# Patient Record
Sex: Female | Born: 1990 | Race: White | Hispanic: No | Marital: Married | Smoking: Never smoker
Health system: Southern US, Community
[De-identification: ages and names within clinical notes are randomized; demographics above are authoritative.]

## PROBLEM LIST (undated history)

## (undated) DIAGNOSIS — K219 Gastro-esophageal reflux disease without esophagitis: Secondary | ICD-10-CM

## (undated) DIAGNOSIS — D696 Thrombocytopenia, unspecified: Secondary | ICD-10-CM

## (undated) DIAGNOSIS — F419 Anxiety disorder, unspecified: Secondary | ICD-10-CM

## (undated) DIAGNOSIS — K589 Irritable bowel syndrome without diarrhea: Secondary | ICD-10-CM

## (undated) HISTORY — DX: Thrombocytopenia, unspecified: D69.6

## (undated) HISTORY — DX: Gastro-esophageal reflux disease without esophagitis: K21.9

## (undated) HISTORY — DX: Irritable bowel syndrome, unspecified: K58.9

## (undated) HISTORY — DX: Anxiety disorder, unspecified: F41.9

## (undated) HISTORY — PX: JOINT REPLACEMENT: SHX530

---
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
## 2008-09-29 ENCOUNTER — Ambulatory Visit (HOSPITAL_COMMUNITY): Admission: RE | Admit: 2008-09-29 | Discharge: 2008-09-29 | Payer: Self-pay | Admitting: Pediatrics

## 2008-10-28 ENCOUNTER — Other Ambulatory Visit: Admission: RE | Admit: 2008-10-28 | Discharge: 2008-10-28 | Payer: Self-pay | Admitting: Family Medicine

## 2010-01-10 ENCOUNTER — Other Ambulatory Visit: Admission: RE | Admit: 2010-01-10 | Discharge: 2010-01-10 | Payer: Self-pay | Admitting: Family Medicine

## 2010-12-06 NOTE — Procedures (Signed)
EEG NUMBER:  [id number]   CLINICAL HISTORY:  The patient is a 20 year old female who had episodes  when she drives through groves of trees where light flashes and she  feels strange and tries to look away.  She took lorazepam before the  test.  (780.02)   PROCEDURE:  The tracing is carried out on a 32-channel digital Cadwell  recorder reformatted into 16 channel montages with one devoted to EKG.  The patient was awake and drowsy during the recording.  The  International 10/20 system lead placement was used.  Medications include  Zoloft, Nexium, and lorazepam.   DESCRIPTION OF FINDINGS:  Dominant frequency is a 11 Hz, 60 microvolt  alpha range activity.  This is superimposed on mixed frequency theta and  lower alpha range activity of 25 microvolts that persists throughout the  record.   Activating procedures with photic stimulation introduced to sustain  driving response from 8-29 Hz without any seizure activity.  Hyperventilation did not cause any significant change.  There was no  interictal epileptiform activity in the form of spikes or sharp waves.  EKG showed a regular sinus rhythm with ventricular response of 54 beats  per minute.   IMPRESSION:  Normal waking record.      Deanna Artis. Sharene Skeans, M.D.  Electronically Signed     FAO:ZHYQ  D:  09/29/2008 21:28:14  T:  09/30/2008 04:19:10  Job #:  [id number]

## 2011-01-26 ENCOUNTER — Other Ambulatory Visit (HOSPITAL_COMMUNITY)
Admission: RE | Admit: 2011-01-26 | Discharge: 2011-01-26 | Disposition: A | Discharge status: Home / Self Care | Payer: BC Managed Care – PPO | Source: Ambulatory Visit | Attending: Family Medicine | Admitting: Family Medicine

## 2011-01-26 ENCOUNTER — Other Ambulatory Visit: Payer: Self-pay | Admitting: Family Medicine

## 2011-01-26 DIAGNOSIS — Z124 Encounter for screening for malignant neoplasm of cervix: Secondary | ICD-10-CM | POA: Insufficient documentation

## 2011-01-26 DIAGNOSIS — Z113 Encounter for screening for infections with a predominantly sexual mode of transmission: Secondary | ICD-10-CM | POA: Insufficient documentation

## 2012-03-01 ENCOUNTER — Other Ambulatory Visit (HOSPITAL_COMMUNITY)
Admission: RE | Admit: 2012-03-01 | Discharge: 2012-03-01 | Disposition: A | Discharge status: Home / Self Care | Payer: BC Managed Care – PPO | Source: Ambulatory Visit | Attending: Family Medicine | Admitting: Family Medicine

## 2012-03-01 ENCOUNTER — Other Ambulatory Visit: Payer: Self-pay | Admitting: Family Medicine

## 2012-03-01 DIAGNOSIS — Z124 Encounter for screening for malignant neoplasm of cervix: Secondary | ICD-10-CM | POA: Insufficient documentation

## 2012-03-22 ENCOUNTER — Other Ambulatory Visit: Payer: Self-pay | Admitting: Dermatology

## 2014-08-26 ENCOUNTER — Other Ambulatory Visit (HOSPITAL_COMMUNITY)
Admission: RE | Admit: 2014-08-26 | Discharge: 2014-08-26 | Disposition: A | Discharge status: Home / Self Care | Payer: BLUE CROSS/BLUE SHIELD | Source: Ambulatory Visit | Attending: Family Medicine | Admitting: Family Medicine

## 2014-08-26 ENCOUNTER — Other Ambulatory Visit: Payer: Self-pay | Admitting: Family Medicine

## 2014-08-26 DIAGNOSIS — Z124 Encounter for screening for malignant neoplasm of cervix: Secondary | ICD-10-CM | POA: Insufficient documentation

## 2014-08-26 DIAGNOSIS — Z113 Encounter for screening for infections with a predominantly sexual mode of transmission: Secondary | ICD-10-CM | POA: Insufficient documentation

## 2014-08-28 LAB — CYTOLOGY - PAP

## 2014-12-01 ENCOUNTER — Other Ambulatory Visit: Payer: Self-pay | Admitting: Gastroenterology

## 2014-12-01 DIAGNOSIS — K219 Gastro-esophageal reflux disease without esophagitis: Principal | ICD-10-CM

## 2014-12-01 DIAGNOSIS — IMO0001 Reserved for inherently not codable concepts without codable children: Secondary | ICD-10-CM

## 2014-12-03 ENCOUNTER — Other Ambulatory Visit: Payer: BLUE CROSS/BLUE SHIELD

## 2014-12-04 ENCOUNTER — Ambulatory Visit
Admission: RE | Admit: 2014-12-04 | Discharge: 2014-12-04 | Disposition: A | Discharge status: Home / Self Care | Payer: BLUE CROSS/BLUE SHIELD | Source: Ambulatory Visit | Attending: Gastroenterology | Admitting: Gastroenterology

## 2014-12-04 DIAGNOSIS — K219 Gastro-esophageal reflux disease without esophagitis: Principal | ICD-10-CM

## 2014-12-04 DIAGNOSIS — IMO0001 Reserved for inherently not codable concepts without codable children: Secondary | ICD-10-CM

## 2015-09-28 ENCOUNTER — Other Ambulatory Visit (HOSPITAL_COMMUNITY)
Admission: RE | Admit: 2015-09-28 | Discharge: 2015-09-28 | Disposition: A | Discharge status: Home / Self Care | Payer: Commercial Managed Care - HMO | Source: Ambulatory Visit | Attending: Family Medicine | Admitting: Family Medicine

## 2015-09-28 ENCOUNTER — Other Ambulatory Visit: Payer: Self-pay | Admitting: Family Medicine

## 2015-09-28 DIAGNOSIS — Z124 Encounter for screening for malignant neoplasm of cervix: Secondary | ICD-10-CM | POA: Insufficient documentation

## 2015-10-01 LAB — CYTOLOGY - PAP

## 2017-11-09 ENCOUNTER — Other Ambulatory Visit: Payer: Self-pay | Admitting: Family Medicine

## 2017-11-09 DIAGNOSIS — K219 Gastro-esophageal reflux disease without esophagitis: Secondary | ICD-10-CM

## 2017-11-09 DIAGNOSIS — R748 Abnormal levels of other serum enzymes: Secondary | ICD-10-CM

## 2017-11-09 DIAGNOSIS — Z0001 Encounter for general adult medical examination with abnormal findings: Secondary | ICD-10-CM

## 2017-11-09 DIAGNOSIS — B369 Superficial mycosis, unspecified: Secondary | ICD-10-CM

## 2017-11-09 DIAGNOSIS — F411 Generalized anxiety disorder: Secondary | ICD-10-CM

## 2017-11-19 ENCOUNTER — Ambulatory Visit
Admission: RE | Admit: 2017-11-19 | Discharge: 2017-11-19 | Disposition: A | Discharge status: Home / Self Care | Payer: 59 | Source: Ambulatory Visit | Attending: Family Medicine | Admitting: Family Medicine

## 2017-11-19 DIAGNOSIS — B369 Superficial mycosis, unspecified: Secondary | ICD-10-CM

## 2017-11-19 DIAGNOSIS — K219 Gastro-esophageal reflux disease without esophagitis: Secondary | ICD-10-CM

## 2017-11-19 DIAGNOSIS — Z0001 Encounter for general adult medical examination with abnormal findings: Secondary | ICD-10-CM

## 2017-11-19 DIAGNOSIS — F411 Generalized anxiety disorder: Secondary | ICD-10-CM

## 2017-11-19 DIAGNOSIS — R748 Abnormal levels of other serum enzymes: Secondary | ICD-10-CM

## 2018-02-08 LAB — OB RESULTS CONSOLE ABO/RH: RH TYPE: POSITIVE

## 2018-02-08 LAB — OB RESULTS CONSOLE RUBELLA ANTIBODY, IGM: Rubella: IMMUNE

## 2018-02-08 LAB — OB RESULTS CONSOLE HIV ANTIBODY (ROUTINE TESTING): HIV: NONREACTIVE

## 2018-02-08 LAB — OB RESULTS CONSOLE GC/CHLAMYDIA
CHLAMYDIA, DNA PROBE: NEGATIVE
Gonorrhea: NEGATIVE

## 2018-02-08 LAB — OB RESULTS CONSOLE RPR: RPR: NONREACTIVE

## 2018-02-08 LAB — OB RESULTS CONSOLE HEPATITIS B SURFACE ANTIGEN: HEP B S AG: NEGATIVE

## 2018-02-08 LAB — OB RESULTS CONSOLE ANTIBODY SCREEN: ANTIBODY SCREEN: NEGATIVE

## 2018-04-16 ENCOUNTER — Telehealth: Payer: Self-pay | Admitting: Hematology and Oncology

## 2018-04-16 NOTE — Telephone Encounter (Signed)
New referral received from [facility] for thrombocytopenia. Pt has been scheduled to see Dr. Caron Presumeuben on 9/25 at 1pm. Pt aware to arrive 30 minutes early.

## 2018-04-17 ENCOUNTER — Inpatient Hospital Stay: Payer: 59 | Attending: Hematology and Oncology | Admitting: Hematology and Oncology

## 2018-04-17 ENCOUNTER — Encounter: Payer: Self-pay | Admitting: Hematology and Oncology

## 2018-04-17 ENCOUNTER — Inpatient Hospital Stay: Payer: 59

## 2018-04-17 ENCOUNTER — Telehealth: Payer: Self-pay | Admitting: Hematology and Oncology

## 2018-04-17 DIAGNOSIS — K219 Gastro-esophageal reflux disease without esophagitis: Secondary | ICD-10-CM | POA: Insufficient documentation

## 2018-04-17 DIAGNOSIS — K589 Irritable bowel syndrome without diarrhea: Secondary | ICD-10-CM | POA: Insufficient documentation

## 2018-04-17 DIAGNOSIS — O99119 Other diseases of the blood and blood-forming organs and certain disorders involving the immune mechanism complicating pregnancy, unspecified trimester: Principal | ICD-10-CM

## 2018-04-17 DIAGNOSIS — D696 Thrombocytopenia, unspecified: Secondary | ICD-10-CM | POA: Diagnosis not present

## 2018-04-17 DIAGNOSIS — K582 Mixed irritable bowel syndrome: Secondary | ICD-10-CM

## 2018-04-17 LAB — COMPREHENSIVE METABOLIC PANEL
ALT: 24 U/L (ref 0–44)
ANION GAP: 7 (ref 5–15)
AST: 19 U/L (ref 15–41)
Albumin: 3.4 g/dL — ABNORMAL LOW (ref 3.5–5.0)
Alkaline Phosphatase: 81 U/L (ref 38–126)
BILIRUBIN TOTAL: 0.3 mg/dL (ref 0.3–1.2)
BUN: 9 mg/dL (ref 6–20)
CO2: 25 mmol/L (ref 22–32)
Calcium: 9 mg/dL (ref 8.9–10.3)
Chloride: 106 mmol/L (ref 98–111)
Creatinine, Ser: 0.61 mg/dL (ref 0.44–1.00)
GFR calc Af Amer: >60 mL/min (ref >60)
GFR calc non Af Amer: >60 mL/min (ref >60)
GLUCOSE: 84 mg/dL (ref 70–99)
POTASSIUM: 4 mmol/L (ref 3.5–5.1)
Sodium: 138 mmol/L (ref 135–145)
TOTAL PROTEIN: 6.7 g/dL (ref 6.5–8.1)

## 2018-04-17 LAB — CBC WITH DIFFERENTIAL (CANCER CENTER ONLY)
BASOS ABS: 0 10*3/uL (ref 0.0–0.1)
Basophils Relative: 0 %
EOS PCT: 1 %
Eosinophils Absolute: 0.1 10*3/uL (ref 0.0–0.5)
HEMATOCRIT: 34.7 % — AB (ref 34.8–46.6)
HEMOGLOBIN: 11.9 g/dL (ref 11.6–15.9)
LYMPHS ABS: 2.8 10*3/uL (ref 0.9–3.3)
LYMPHS PCT: 27 %
MCH: 30.5 pg (ref 25.1–34.0)
MCHC: 34.3 g/dL (ref 31.5–36.0)
MCV: 89 fL (ref 79.5–101.0)
Monocytes Absolute: 0.6 10*3/uL (ref 0.1–0.9)
Monocytes Relative: 5 %
NEUTROS ABS: 6.7 10*3/uL — AB (ref 1.5–6.5)
Neutrophils Relative %: 67 %
PLATELETS: 127 10*3/uL — AB (ref 145–400)
RBC: 3.9 MIL/uL (ref 3.70–5.45)
RDW: 13.8 % (ref 11.2–14.5)
WBC: 10.1 10*3/uL (ref 3.9–10.3)

## 2018-04-17 LAB — PROTIME-INR
INR: 0.81
Prothrombin Time: 11.1 seconds — ABNORMAL LOW (ref 11.4–15.2)

## 2018-04-17 LAB — APTT: APTT: 26 s (ref 24–36)

## 2018-04-17 NOTE — Telephone Encounter (Signed)
Gave pt avs and calendar  °

## 2018-04-17 NOTE — Progress Notes (Signed)
[facility] Outpatient Hematology/Oncology Initial Consultation  Patient Name:  Debra Nixon  DOB: Dec 13, 1990   Date of Service: April 17, 2018  Referring Provider: Dois Davenport, Md [address] [place], [place] [address]   Consulting Physician: Toni Arthurs, MD Hematology/Oncology  Patient Care Team: Patient Care Team: Dois Davenport, MD as PCP - General (Family Medicine)    Reason for Referral: In the setting of a prior history of thrombocytopenia of unclear etiology at least 5 years prior to this, her first pregnancy, she presents now with mild thrombocytopenia (119,000) without bleeding tendency for further diagnostic and therapeutic recommendations.  History Present Illness: Debra Nixon is a 27 year old resident of [place] whose past medical history is significant for gastroesophageal reflux disease for the past 20 years; irritable bowel syndrome, associated with mild bloating and episodic diarrhea and especially constipation; and for at least the past 5 years, mild thrombocytopenia (>100,000) of unclear etiology.  Her best recollection through a different primary care provider, was that her baseline prepregnancy platelet count was 130-140,000 platelets.  She has never had any bleeding diathesis.  She has no history in her immediate family of any blood disorder, bleeding tendency, or easy bruisability.  There is no history of peripheral arterial or venous thromboembolic disease.  Her primary care physician is Dr.Karen Corporate treasurer.  Her attending obstetrician is Dr.Kelly Fogelman.  Lonia has been in excellent health.  On September 18, after she was pregnant, a complete blood count showed hemoglobin 13.6 hematocrit 46.5 WBC 8.6; platelets 119,000.  She had no bleeding diathesis.  She reports no history of diabetes, primary hypertension, or coronary artery disease.  She has no cardiac dysrhythmia or dyslipidemia.  There is no seizure disorder or  prior stroke syndrome.  She has no thyroid disease.  Her gastroesophageal reflux disease is controlled with pantoprazole.  She has no peptic ulcer disease.  She reports no inflammatory bowel disease, diverticulosis, or viral hepatitis.  She denies degenerative joint disease, rheumatoid or gouty arthritis.  She has no peripheral arterial venous thromboembolic disease.  During her first trimester she did have nausea which has resolved.  Her overall energy level is fairly robust.  She works on a full-time basis in Audiological scientist.  There is no appetite deficit.  She has no rash or itching.  There are no visual changes or hearing deficit.  She denies any dental pain.  There is no cough, sore throat, or orthopnea.  She denies dyspnea either at rest or on exertion.  She has no pain or difficulty in swallowing.  No fever, shaking chills, sweats, or flulike symptoms are evident.  There is no nausea, vomiting, heartburn, or indigestion at present.  She currently moves her bowels every other day.  She denies melena or bright red blood per rectum.  No urinary frequency, urgency, hematuria, or dysuria are reported.  She has no swelling of her ankles.  There are no focal areas of bone, joint, muscle pain.  She has no bleeding tendency or easy bruisability.  Her sleep is without difficulty.  She denies numbness or tingling in the fingers or toes.  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 Her last menstruation was Nov 25, 2017 She is a gravida 1 para 0 This is her first pregnancy. She was on oral  contraception from 2000-2018. Her last Pap smear was April, 2019 She is never had a screening mammogram. She has no prior breast biopsy.  Family History: Mother: Age 40 years: No major medical problems Father: Age 63 years: Early stage cutaneous malignant melanoma. She has no brothers. She has 1 sister without major medical problem.  No history of thrombocytopenia or hematologic disorder within the immediate family.  Social History   Socioeconomic History  . Marital status: Single    Spouse name: Not on file  . Number of children: Not on file  . Years of education: Not on file  . Highest education level: Not on file  Occupational History  . Not on file  Social Needs  . Financial resource strain: Not on file  . Food insecurity:    Worry: Not on file    Inability: Not on file  . Transportation needs:    Medical: Not on file    Non-medical: Not on file  Tobacco Use  . Smoking status: Not on file  Substance and Sexual Activity  . Alcohol use: Not on file  . Drug use: Not on file  . Sexual activity: Not on file  Lifestyle  . Physical activity:    Days per week: Not on file    Minutes per session: Not on file  . Stress: Not on file  Relationships  . Social connections:    Talks on phone: Not on file    Gets together: Not on file    Attends religious service: Not on file    Active member of club or organization: Not on file    Attends meetings of clubs or organizations: Not on file    Relationship status: Not on file  . Intimate partner violence:    Fear of current or ex partner: Not on file    Emotionally abused: Not on file    Physically abused: Not on file    Forced sexual activity: Not on file  Other Topics Concern  . Not on file  Social History Narrative  . Not on file  Her occupation is in accounting. She is married for the past 4 years. Her alcohol intake prior consisted of wine 2-3 times weekly, now none. There is no report of recreational drug use or  e-cigarettes.  Transfusion History: No prior transfusion  Exposure History: She has no known exposure to toxic chemicals, radiation, or pesticides  Allergies:  She has no known medical allergies No food allergies She has nonspecific seasonal allergies  Current Medications: Current Outpatient Medications on File Prior to Visit  Medication Sig  . loratadine (CLARITIN) 10 MG tablet Take 10 mg by mouth daily.  . pantoprazole (PROTONIX) 20 MG tablet Take 20 mg by mouth daily.  . Prenatal Vit-Fe Fumarate-FA (PRENATAL VITAMINS PLUS PO)   Review of Systems: Constitutional: No fever, sweats, or shaking chills.  No appetite or weight deficit.  20 weeks of pregnancy; energy level normal/expected. Skin: No rash, scaling, sores, lumps, or jaundice. HEENT: No visual changes or hearing deficit. Pulmonary: No unusual cough, sore throat, or orthopnea; no DOE or COPD. Breasts: No complaints. Cardiovascular: No coronary artery disease, angina, or myocardial infarction.  No cardiac dysrhythmia, essential hypertension, or dyslipidemia. Gastrointestinal: No indigestion, dysphagia, abdominal pain, diarrhea, or constipation.  No change in bowel habits; irritable bowel syndrome with  constipation predominant over diarrhea, now stable; GERD. Genitourinary: No urinary frequency, urgency, hematuria, or dysuria; 20 months gravida; first pregnancy. Musculoskeletal: No arthralgias or myalgias; no joint swelling, pain, or instability. Hematologic: No bleeding tendency or easy bruisability; mild thrombocytopenia 5 years prior to pregnancy Endocrine: No intolerance to heat or cold; no thyroid disease or diabetes mellitus. Vascular: No peripheral arterial or venous thromboembolic disease. Psychological: No anxiety, depression, or mood changes; no mental health illnesses. Neurological: No dizziness, lightheadedness, syncope, or near syncopal episodes; no numbness or tingling in the fingers or toes.  Physical  Examination: Vitals:   04/17/18 1303  BP: 122/79  Pulse: 98  Resp: 18  Temp: 98.3 F (36.8 C)  SpO2: 100%    Filed Weights   04/17/18 1303  Weight: 72.7 kg  Constitutional:  Cherry [place] is fully nourished and developed. She looks age appropriate.  She is friendly and cooperative without respiratory compromise at rest. Skin: No rashes, scaling, dryness, jaundice, or itching. HEENT: Head is normocephalic and atraumatic.  Pupils are equal round and reactive to light and accommodation.  Sclerae are anicteric.  Conjunctivae are pink.  No sinus tenderness nor oropharyngeal lesions.  Lips without cracking or peeling; tongue without mass, inflammation, or nodularity.  Mucous membranes are moist. Neck: Supple and symmetric.  No jugular venous distention or thyromegaly.  Trachea is midline. Lymphatics: No cervical or supraclavicular lymphadenopathy.  No epitrochlear, axillary, or inguinal lymphadenopathy is appreciated. Respiratory/chest: Thorax is symmetrical.  Breath sounds are clear to auscultation and percussion.  Normal excursion and respiratory effort. Back: Symmetric without deformity or tenderness. Cardiovascular: Heart rate and rhythm are regular without murmurs, gallops, or rubs. Gastrointestinal: Abdomen is 20-week gravida but nontender; no organomegaly.  Bowel sounds are normoactive.  No masses are appreciated. Rectal examination: Not performed. Extremities: In the lower extremities, there is no asymmetric swelling, erythema, tenderness, or cord formation.  No clubbing, cyanosis, nor edema. Hematologic: No petechiae, hematomas, or ecchymoses. Psychological She is oriented to person, place, and time; normal affect, memory, and cognition. Neurological: There are no gross neurologic deficits.  Laboratory Results: April 10, 2018: A complete blood count shows hemoglobin 13.6 hematocrit 46.5 WBC 8.6; 119,000 platelets.  The laboratory studies from today are not available at the time  of dictation.  Diagnostic/Imaging Studies: No results found.  Summary/Assessment: 1. In the setting of a prior history of thrombocytopenia of unclear etiology at least 5 years prior to this, her first pregnancy, she presents now with mild thrombocytopenia (119,000) without bleeding tendency for further diagnostic and therapeutic recommendations.  2.  On September 18, after she was pregnant, a complete blood count showed hemoglobin 13.6 hematocrit 46.5 WBC 8.6; platelets 119,000.  She had no bleeding diathesis.  She reports no history of diabetes, primary hypertension, or coronary artery disease.  She has no cardiac dysrhythmia or dyslipidemia.  There is no seizure disorder or prior stroke syndrome.  She has no thyroid disease.  Her gastroesophageal reflux disease is controlled with pantoprazole.  She has no peptic ulcer disease.  She reports no inflammatory bowel disease, diverticulosis, or viral hepatitis.  She denies degenerative joint disease, rheumatoid or gouty arthritis.  She has no peripheral arterial venous thromboembolic disease.  During her first trimester she did have nausea which has resolved.  Her overall energy level is fairly robust.  She works on a full-time basis in Audiological scientist.  3. There is no appetite deficit.  She has no rash or itching.  There are no visual changes or hearing deficit.  She denies any dental pain.  There is no cough, sore throat, or orthopnea.  She denies dyspnea either at rest or on exertion.  She has no pain or difficulty in swallowing.  No fever, shaking chills, sweats, or flulike symptoms are evident.  There is no nausea, vomiting, heartburn, or indigestion at present.  She currently moves her bowels every other day.  She denies melena or bright red blood per rectum.  No urinary frequency, urgency, hematuria, or dysuria are reported.  She has no swelling of her ankles.  There are no focal areas of bone, joint, muscle pain.  She has no bleeding tendency or easy  bruisability.  Her sleep is without difficulty.  She denies numbness or tingling in the fingers or toes.   4.  Her other comorbid problems include gastroesophageal reflux disease for the past 20 years; irritable bowel syndrome, associated with mild bloating and episodic diarrhea and especially constipation; and for at least the past 5 years, mild thrombocytopenia (>100,000) of unclear etiology.  Her best recollection was through a different primary care provider, that her baseline prepregnancy platelet count was 130-140,000 platelets.  She has never had any bleeding diathesis.  She has no history in her immediate family of any blood disorder, bleeding tendency, or easy bruisability.   Recommendation/Plan: 1.  I had a lengthy discussion with Doll regarding the role of a hematologist, in general, in the setting of thrombocytopenia of pregnancy.  As much as possible, it is important to determine or exclude an underlying cause; advise in the management of thrombocytopenia; and help estimate the risk to the mother and fetus.  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  Immune-related thrombocytopenia occurs in some 5% of individuals.  Secondary immune-thrombocytopenia includes thrombocytopenia secondary to some infections such as H. pylori, autoimmune disorders, or infection.  Fortunately, Zyasia has no systemic symptoms that suggest an underlying non-pregnancy specific disease.  2.  We reviewed in detail her prior history of thrombocytopenia, and her most recent laboratory studies obtained through Dr. Algie Coffer.  Those results suggest a platelet count of 119,000.  Both her white blood cell count and hemoglobin were normal.  Those results are detailed above.  3.  The results of her laboratory studies from today were not available at the time of discharge.  They will be discussed in detail at the time of her next visit.  At the present time, she  may still have gestational thrombocytopenia, also called incidental thrombocytopenia of pregnancy.  Presumably, with a baseline platelet count of 130-140,000, this may represent her normal platelet count which falls slightly outside the normal bell curve distribution but may represent a variant of "normal.".  It is also possible that she has mild immune-thrombocytopenia.  As long as her platelets remain >100,000, a more detailed evaluation is unnecessary.  4.  It is important to catalog and monitor her platelet count throughout the course of her pregnancy.  In the absence of any precipitous drop in platelets, I recommended monthly blood work until 1 week prior to her delivery.  At the present time, there is no untoward risk to either [place] or fetus.  If her platelet count remains >100,000, either vaginal or cesarean delivery are possible as obstetrically indicated.  5.  Barring any unforeseen complications, her next scheduled doctor visit with laboratory studies is on October 23.  6.  She was advised to call us in the interim should any new or untoward problems arise  especially those related to bleeding or bleeding diathesis.  At the present time she is asymptomatic.  The total time spent discussing thrombocytopenia of pregnancy, methodology for evaluating thrombocytopenia if the platelet count is <100,000, her history of mild thrombocytopenia 5 years ago; preliminary considerations with recommendations and plan was 50 minutes.  At least 50% of that time was spent in discussion, reviewing outside records, laboratory evaluation, counseling, and answering questions. All questions were answered to her satisfaction. She knows to call the me with any problems, questions, or concerns.  This note was dictated using voice activated technology/software.  Unfortunately, typographical errors are not uncommon, and transcription is subject to mistakes and regrettably misinterpretation.  If necessary, clarification of  the above information can be discussed with me at any time.  Thank you Drs. Algie Coffer and Hal Hope for allowing my participation in the care of [employer]. I will keep you closely informed as the results of her preliminary laboratory data become available.  Please do not hesitate to call should any questions arise regarding this initial consultation and discussion.  FOLLOW UP: AS DIRECTED   cc:   Noland Fordyce, MD         Nadyne Coombes, MD   Toni Arthurs, MD  Hematology/Oncology [facility] [address] [place], [place] [address] Office: [phone] Main: [phone]

## 2018-04-17 NOTE — Patient Instructions (Signed)
April 17, 2018 We discussed in detail your history of mild thrombocytopenia (low platelets) at least 5 years prior to pregnancy. Your platelet count today is not available at the time of discharge.  Your most recent platelet count was 119,000.  You can find those results either in the patient portal or you can call our office unless notified sooner.  A conservative approach initially is to repeat your complete blood count monthly until a few days before delivery.  With platelets >100,000, either a C-section or normal vaginal delivery is feasible.  This obstetric decision is made by your obstetrician.  If the platelets drop <100,000, an attempt will be made to identify the cause of thrombocytopenia.  At present, there are no symptoms suggestive of a systemic disease component and there are no physical signs of coagulation or platelet dysfunction.  This makes a conservative approach the most reasonable at this time.  Your are now scheduled for return visit and complete blood count on October 23.  Please do not hesitate to call should any questions arise in the interim. Thank you.  Debbe Odea, MD Hematology/Oncology

## 2018-04-19 NOTE — Progress Notes (Signed)
Per Dr. Caron Presume, I faxed labs and office note to Dr. Noland Fordyce. Fax number is [phone].

## 2018-04-21 ENCOUNTER — Other Ambulatory Visit: Payer: Self-pay | Admitting: Hematology and Oncology

## 2018-04-24 ENCOUNTER — Telehealth: Payer: Self-pay

## 2018-04-24 NOTE — Telephone Encounter (Signed)
Patient had expressed concern that she would have to miss more work, wanting Dr. Elpidio Eric office to drop her labwork and fax the results to [place] monthly.    Explained I would like Dr. Clide Dales know and to expect these results.

## 2018-04-25 ENCOUNTER — Telehealth: Payer: Self-pay | Admitting: Hematology and Oncology

## 2018-04-25 NOTE — Telephone Encounter (Signed)
Cancelled appt per patient request  - called patient per 10/2 sch message - pt did not want to r/s yet - she said she will give a call back when she is back in town .

## 2018-05-15 ENCOUNTER — Ambulatory Visit: Payer: 59 | Admitting: Hematology and Oncology

## 2018-05-15 ENCOUNTER — Other Ambulatory Visit: Payer: 59

## 2018-07-02 DIAGNOSIS — D696 Thrombocytopenia, unspecified: Secondary | ICD-10-CM | POA: Diagnosis not present

## 2018-07-02 DIAGNOSIS — Z3A31 31 weeks gestation of pregnancy: Secondary | ICD-10-CM | POA: Diagnosis not present

## 2018-07-02 DIAGNOSIS — O99113 Other diseases of the blood and blood-forming organs and certain disorders involving the immune mechanism complicating pregnancy, third trimester: Secondary | ICD-10-CM | POA: Diagnosis not present

## 2018-07-11 DIAGNOSIS — O99113 Other diseases of the blood and blood-forming organs and certain disorders involving the immune mechanism complicating pregnancy, third trimester: Secondary | ICD-10-CM | POA: Diagnosis not present

## 2018-07-11 DIAGNOSIS — D696 Thrombocytopenia, unspecified: Secondary | ICD-10-CM | POA: Diagnosis not present

## 2018-07-11 DIAGNOSIS — Z3A32 32 weeks gestation of pregnancy: Secondary | ICD-10-CM | POA: Diagnosis not present

## 2018-07-23 DIAGNOSIS — O99113 Other diseases of the blood and blood-forming organs and certain disorders involving the immune mechanism complicating pregnancy, third trimester: Secondary | ICD-10-CM | POA: Diagnosis not present

## 2018-07-23 DIAGNOSIS — D696 Thrombocytopenia, unspecified: Secondary | ICD-10-CM | POA: Diagnosis not present

## 2018-07-23 DIAGNOSIS — Z3A34 34 weeks gestation of pregnancy: Secondary | ICD-10-CM | POA: Diagnosis not present

## 2018-08-09 DIAGNOSIS — Z3A36 36 weeks gestation of pregnancy: Secondary | ICD-10-CM | POA: Diagnosis not present

## 2018-08-09 DIAGNOSIS — O99113 Other diseases of the blood and blood-forming organs and certain disorders involving the immune mechanism complicating pregnancy, third trimester: Secondary | ICD-10-CM | POA: Diagnosis not present

## 2018-08-09 DIAGNOSIS — D696 Thrombocytopenia, unspecified: Secondary | ICD-10-CM | POA: Diagnosis not present

## 2018-08-09 DIAGNOSIS — Z3685 Encounter for antenatal screening for Streptococcus B: Secondary | ICD-10-CM | POA: Diagnosis not present

## 2018-08-09 LAB — OB RESULTS CONSOLE GBS: GBS: NEGATIVE

## 2018-08-16 DIAGNOSIS — D696 Thrombocytopenia, unspecified: Secondary | ICD-10-CM | POA: Diagnosis not present

## 2018-08-16 DIAGNOSIS — Z3A37 37 weeks gestation of pregnancy: Secondary | ICD-10-CM | POA: Diagnosis not present

## 2018-08-16 DIAGNOSIS — O99113 Other diseases of the blood and blood-forming organs and certain disorders involving the immune mechanism complicating pregnancy, third trimester: Secondary | ICD-10-CM | POA: Diagnosis not present

## 2018-08-20 ENCOUNTER — Telehealth (HOSPITAL_COMMUNITY): Payer: Self-pay | Admitting: *Deleted

## 2018-08-20 ENCOUNTER — Encounter (HOSPITAL_COMMUNITY): Payer: Self-pay | Admitting: *Deleted

## 2018-08-20 ENCOUNTER — Other Ambulatory Visit: Payer: Self-pay | Admitting: Obstetrics

## 2018-08-20 NOTE — Telephone Encounter (Signed)
Preadmission screen  

## 2018-08-21 ENCOUNTER — Encounter (HOSPITAL_COMMUNITY): Payer: Self-pay | Admitting: *Deleted

## 2018-08-21 ENCOUNTER — Telehealth (HOSPITAL_COMMUNITY): Payer: Self-pay | Admitting: *Deleted

## 2018-08-21 NOTE — Telephone Encounter (Signed)
Preadmission screen  

## 2018-08-23 DIAGNOSIS — O99113 Other diseases of the blood and blood-forming organs and certain disorders involving the immune mechanism complicating pregnancy, third trimester: Secondary | ICD-10-CM | POA: Diagnosis not present

## 2018-08-23 DIAGNOSIS — D696 Thrombocytopenia, unspecified: Secondary | ICD-10-CM | POA: Diagnosis not present

## 2018-08-23 DIAGNOSIS — Z3A38 38 weeks gestation of pregnancy: Secondary | ICD-10-CM | POA: Diagnosis not present

## 2018-08-25 NOTE — H&P (Deleted)
Debra Nixon is a 28 y.o. 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 Pt notes rare contractions . Good fetal movement, No vaginal bleeding, not leaking fluid.  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 Anxiety   . GERD (gastroesophageal reflux disease)   . IBS (irritable bowel syndrome)   . Thrombocytopenia (HCC)    Past Surgical History:  Procedure Laterality Date  . JOINT REPLACEMENT     Family History: family history includes Breast cancer in her paternal grandmother; Melanoma in her father. Social History:  reports that she has never smoked. She has never used smokeless tobacco. She reports previous alcohol use. She reports that she does not use drugs.  Review of Systems - Negative except discomfort of pregnancy     There were no vitals taken for this visit.  Physical Exam:    Prenatal labs: ABO, Rh: O/Positive/-- (07/19 0000) Antibody: Negative (07/19 0000) Rubella: Immune (07/19 0000) RPR: Nonreactive (07/19 0000)  HBsAg: Negative (07/19 0000)  HIV: Non-reactive (07/19 0000)  GBS: Negative (01/17 0000)  1 hr Glucola 119  Genetic screening nl NT, nl AFP Anatomy US normal   Assessment/Plan: 28 y.o. G1P0 at [redacted]w[redacted]d IOL at term, low plts, ITP vs gestational thrombocytopenia Iol. cytotec x 2 o/n then pitocin in am.     Lendon Colonel 08/25/2018, 4:10 PM

## 2018-08-26 ENCOUNTER — Encounter (HOSPITAL_COMMUNITY): Payer: Self-pay

## 2018-08-26 ENCOUNTER — Inpatient Hospital Stay (HOSPITAL_COMMUNITY): Payer: BLUE CROSS/BLUE SHIELD | Admitting: Anesthesiology

## 2018-08-26 ENCOUNTER — Inpatient Hospital Stay (HOSPITAL_COMMUNITY)
Admission: RE | Admit: 2018-08-26 | Discharge: 2018-08-30 | DRG: 788 | Disposition: A | Discharge status: Home / Self Care | Payer: BLUE CROSS/BLUE SHIELD | Attending: Obstetrics | Admitting: Obstetrics

## 2018-08-26 ENCOUNTER — Other Ambulatory Visit: Payer: Self-pay

## 2018-08-26 DIAGNOSIS — O26893 Other specified pregnancy related conditions, third trimester: Secondary | ICD-10-CM | POA: Diagnosis present

## 2018-08-26 DIAGNOSIS — F419 Anxiety disorder, unspecified: Secondary | ICD-10-CM | POA: Diagnosis present

## 2018-08-26 DIAGNOSIS — O99344 Other mental disorders complicating childbirth: Secondary | ICD-10-CM | POA: Diagnosis not present

## 2018-08-26 DIAGNOSIS — O9902 Anemia complicating childbirth: Secondary | ICD-10-CM | POA: Diagnosis not present

## 2018-08-26 DIAGNOSIS — O9912 Other diseases of the blood and blood-forming organs and certain disorders involving the immune mechanism complicating childbirth: Secondary | ICD-10-CM | POA: Diagnosis not present

## 2018-08-26 DIAGNOSIS — K219 Gastro-esophageal reflux disease without esophagitis: Secondary | ICD-10-CM | POA: Diagnosis present

## 2018-08-26 DIAGNOSIS — D696 Thrombocytopenia, unspecified: Secondary | ICD-10-CM | POA: Diagnosis present

## 2018-08-26 DIAGNOSIS — Z6791 Unspecified blood type, Rh negative: Secondary | ICD-10-CM | POA: Diagnosis not present

## 2018-08-26 DIAGNOSIS — O99119 Other diseases of the blood and blood-forming organs and certain disorders involving the immune mechanism complicating pregnancy, unspecified trimester: Secondary | ICD-10-CM

## 2018-08-26 DIAGNOSIS — Z412 Encounter for routine and ritual male circumcision: Secondary | ICD-10-CM | POA: Diagnosis not present

## 2018-08-26 DIAGNOSIS — D649 Anemia, unspecified: Secondary | ICD-10-CM | POA: Diagnosis not present

## 2018-08-26 DIAGNOSIS — D6959 Other secondary thrombocytopenia: Secondary | ICD-10-CM | POA: Diagnosis present

## 2018-08-26 DIAGNOSIS — Z3A39 39 weeks gestation of pregnancy: Secondary | ICD-10-CM

## 2018-08-26 DIAGNOSIS — O9962 Diseases of the digestive system complicating childbirth: Secondary | ICD-10-CM | POA: Diagnosis present

## 2018-08-26 DIAGNOSIS — O99113 Other diseases of the blood and blood-forming organs and certain disorders involving the immune mechanism complicating pregnancy, third trimester: Secondary | ICD-10-CM | POA: Diagnosis not present

## 2018-08-26 LAB — CBC
HCT: 36.4 % (ref 36.0–46.0)
HEMATOCRIT: 33.9 % — AB (ref 36.0–46.0)
HEMATOCRIT: 37.4 % (ref 36.0–46.0)
HEMOGLOBIN: 12.3 g/dL (ref 12.0–15.0)
Hemoglobin: 11.5 g/dL — ABNORMAL LOW (ref 12.0–15.0)
Hemoglobin: 12.7 g/dL (ref 12.0–15.0)
MCH: 30.7 pg (ref 26.0–34.0)
MCH: 30.7 pg (ref 26.0–34.0)
MCH: 30.8 pg (ref 26.0–34.0)
MCHC: 33.9 g/dL (ref 30.0–36.0)
MCHC: 33.8 g/dL (ref 30.0–36.0)
MCHC: 34 g/dL (ref 30.0–36.0)
MCV: 90.6 fL (ref 80.0–100.0)
MCV: 90.8 fL (ref 80.0–100.0)
MCV: 90.8 fL (ref 80.0–100.0)
Platelets: 104 10*3/uL — ABNORMAL LOW (ref 150–400)
Platelets: 111 10*3/uL — ABNORMAL LOW (ref 150–400)
Platelets: 116 10*3/uL — ABNORMAL LOW (ref 150–400)
RBC: 3.74 MIL/uL — ABNORMAL LOW (ref 3.87–5.11)
RBC: 4.01 MIL/uL (ref 3.87–5.11)
RBC: 4.12 MIL/uL (ref 3.87–5.11)
RDW: 13.5 % (ref 11.5–15.5)
RDW: 13.3 % (ref 11.5–15.5)
RDW: 13.5 % (ref 11.5–15.5)
WBC: 10.9 10*3/uL — ABNORMAL HIGH (ref 4.0–10.5)
WBC: 12.6 10*3/uL — ABNORMAL HIGH (ref 4.0–10.5)
WBC: 16.5 10*3/uL — ABNORMAL HIGH (ref 4.0–10.5)
nRBC: 0 % (ref 0.0–0.2)
nRBC: 0 % (ref 0.0–0.2)

## 2018-08-26 MED ORDER — EPHEDRINE 5 MG/ML INJ: 10.0000 mg | INTRAVENOUS | Status: DC | PRN

## 2018-08-26 MED ORDER — PHENYLEPHRINE 40 MCG/ML (10ML) SYRINGE FOR IV PUSH (FOR BLOOD PRESSURE SUPPORT): 80.0000 ug | PREFILLED_SYRINGE | INTRAVENOUS | Status: DC | PRN

## 2018-08-26 MED ORDER — TERBUTALINE SULFATE 1 MG/ML IJ SOLN: 0.2500 mg | Freq: Once | INTRAMUSCULAR | Status: DC | PRN

## 2018-08-26 MED ORDER — FENTANYL 2.5 MCG/ML BUPIVACAINE 1/10 % EPIDURAL INFUSION (WH - ANES)
14.0000 mL/h | INTRAMUSCULAR | Status: DC | PRN
  Administered 2018-08-26 – 2018-08-27 (×2): 14 mL/h via EPIDURAL
  Filled 2018-08-26 (×3): qty 100

## 2018-08-26 MED ORDER — LIDOCAINE HCL (PF) 1 % IJ SOLN: 30.0000 mL | INTRAMUSCULAR | Status: DC | PRN

## 2018-08-26 MED ORDER — MISOPROSTOL 25 MCG QUARTER TABLET
25.0000 ug | ORAL_TABLET | ORAL | Status: DC | PRN
  Administered 2018-08-26 (×2): 25 ug via VAGINAL
  Filled 2018-08-26 (×2): qty 1

## 2018-08-26 MED ORDER — LACTATED RINGERS IV SOLN
INTRAVENOUS | Status: DC
  Administered 2018-08-26 – 2018-08-27 (×3): via INTRAVENOUS

## 2018-08-26 MED ORDER — FENTANYL 2.5 MCG/ML BUPIVACAINE 1/10 % EPIDURAL INFUSION (WH - ANES)
INTRAMUSCULAR | Status: AC
  Filled 2018-08-26: qty 100

## 2018-08-26 MED ORDER — ACETAMINOPHEN 325 MG PO TABS: 650.0000 mg | ORAL_TABLET | ORAL | Status: DC | PRN

## 2018-08-26 MED ORDER — LACTATED RINGERS IV SOLN: 500.0000 mL | Freq: Once | INTRAVENOUS | Status: DC

## 2018-08-26 MED ORDER — OXYTOCIN 40 UNITS IN NORMAL SALINE INFUSION - SIMPLE MED: 2.5000 [IU]/h | INTRAVENOUS | Status: DC

## 2018-08-26 MED ORDER — ONDANSETRON HCL 4 MG/2ML IJ SOLN
4.0000 mg | Freq: Four times a day (QID) | INTRAMUSCULAR | Status: DC | PRN
  Administered 2018-08-27: 4 mg via INTRAVENOUS

## 2018-08-26 MED ORDER — LIDOCAINE-EPINEPHRINE (PF) 2 %-1:200000 IJ SOLN
INTRAMUSCULAR | Status: DC | PRN
  Administered 2018-08-26: 4 mL via EPIDURAL
  Administered 2018-08-26: 3 mL via EPIDURAL

## 2018-08-26 MED ORDER — OXYTOCIN 40 UNITS IN NORMAL SALINE INFUSION - SIMPLE MED
1.0000 m[IU]/min | INTRAVENOUS | Status: DC
  Administered 2018-08-26: 2 m[IU]/min via INTRAVENOUS
  Administered 2018-08-27: 22 m[IU]/min via INTRAVENOUS
  Administered 2018-08-27: 24 m[IU]/min via INTRAVENOUS
  Filled 2018-08-26: qty 1000

## 2018-08-26 MED ORDER — LACTATED RINGERS IV SOLN: 500.0000 mL | INTRAVENOUS | Status: DC | PRN

## 2018-08-26 MED ORDER — SOD CITRATE-CITRIC ACID 500-334 MG/5ML PO SOLN
30.0000 mL | ORAL | Status: DC | PRN
  Administered 2018-08-27: 30 mL via ORAL
  Filled 2018-08-26: qty 15

## 2018-08-26 MED ORDER — DIPHENHYDRAMINE HCL 50 MG/ML IJ SOLN: 12.5000 mg | INTRAMUSCULAR | Status: DC | PRN

## 2018-08-26 MED ORDER — PHENYLEPHRINE 40 MCG/ML (10ML) SYRINGE FOR IV PUSH (FOR BLOOD PRESSURE SUPPORT)
PREFILLED_SYRINGE | INTRAVENOUS | Status: AC
  Filled 2018-08-26: qty 10

## 2018-08-26 MED ORDER — OXYTOCIN BOLUS FROM INFUSION: 500.0000 mL | Freq: Once | INTRAVENOUS | Status: DC

## 2018-08-26 NOTE — Progress Notes (Signed)
S: Doing well, no complaints, pain well controlled though likely planning epidural.  Pt notes increase in contractions since pitocin upped  O: BP (!) 111/58   Pulse 75   Temp 98.2 F (36.8 C)   Resp 16   Ht 5\' 2"  (1.575 m)   Wt 85 kg   BMI 34.29 kg/m   Exam done: Gen: well appearing, anxious Skin: warm, dry Abd: gravid, NT LE: NT, no edema  FHT:  FHR: 150s bpm, variability: moderate,  accelerations:  Present,  decelerations:  Absent UC:   regular, every 1-4 minutes SVE:   Dilation: 1 Effacement (%): Thick Station: Ballotable Exam by:: Kris Hartmann, RN  Cervical foley placed: exam confirms thick, FT cvx, vtx position. Consent obtained. Pt placed in lithotomy, sterile speculum exam, betating x 3 to cvx, Using a ring forcep, foley catheter threaded through cvx and filled to 60 cc. Pt tolerated well with significant anxiety.  A / P:  28 y.o.  OB History  Gravida Para Term Preterm AB Living  1 0 0 0 0 0  SAB TAB Ectopic Multiple Live Births  0 0 0 0 0   at [redacted]w[redacted]d IOL, thombocytopenia  Fetal Wellbeing:  Category I Pain Control:  Labor support without medications  Anticipated MOD:  NSVD  Lendon Colonel 08/26/2018, 2:28 PM

## 2018-08-26 NOTE — Anesthesia Pain Management Evaluation Note (Signed)
  CRNA Pain Management Visit Note  Patient: Debra Nixon, 28 y.o., female  "Hello I am a member of the anesthesia team at [facility]. We have an anesthesia team available at all times to provide care throughout the hospital, including epidural management and anesthesia for C-section. I don't know your plan for the delivery whether it a natural birth, water birth, IV sedation, nitrous supplementation, doula or epidural, but we want to meet your pain goals."   1.Was your pain managed to your expectations on prior hospitalizations?   No prior hospitalizations  2.What is your expectation for pain management during this hospitalization?     Epidural  3.How can we help you reach that goal? The patient desires and epidural but has a history of thrombocytopenia. Platelets @ 104 this morning. Discussed epidural with patient and lab just obtained new platelet sample for evaluation. The patient would like to have a "dry cath" placed if possible and is even willing to have a platelet transfusion if necessary.  Record the patient's initial score and the patient's pain goal.   Pain: 4  Pain Goal: 7 [facility] wants you to be able to say your pain was always managed very well.  Silas Sedam 08/26/2018

## 2018-08-26 NOTE — Progress Notes (Signed)
Spoke to [employer], Scientist, clinical (histocompatibility and immunogenetics). She stated that Dr. Armond Hang did review the platelett count and states that patient may have epidural when needed per pain level.

## 2018-08-26 NOTE — Anesthesia Preprocedure Evaluation (Addendum)
Anesthesia Evaluation  Patient identified by MRN, date of birth, ID band Patient awake    Reviewed: Allergy & Precautions, NPO status , Patient's Chart, lab work & pertinent test results  Airway Mallampati: I  TM Distance: >3 FB Neck ROM: Full    Dental no notable dental hx. (+) Teeth Intact, Dental Advisory Given   Pulmonary neg pulmonary ROS,    Pulmonary exam normal breath sounds clear to auscultation       Cardiovascular negative cardio ROS Normal cardiovascular exam Rhythm:Regular Rate:Normal     Neuro/Psych PSYCHIATRIC DISORDERS Anxiety negative neurological ROS     GI/Hepatic Neg liver ROS, GERD  ,  Endo/Other  negative endocrine ROS  Renal/GU negative Renal ROS  negative genitourinary   Musculoskeletal negative musculoskeletal ROS (+)   Abdominal   Peds  Hematology negative hematology ROS (+)   Anesthesia Other Findings IOL from thrombocytopenia  Reproductive/Obstetrics negative OB ROS                            Anesthesia Physical Anesthesia Plan  ASA: II  Anesthesia Plan: Epidural   Post-op Pain Management:    Induction:   PONV Risk Score and Plan: 2 and Treatment may vary due to age or medical condition  Airway Management Planned: Natural Airway  Additional Equipment:   Intra-op Plan:   Post-operative Plan:   Informed Consent: I have reviewed the patients History and Physical, chart, labs and discussed the procedure including the risks, benefits and alternatives for the proposed anesthesia with the patient or authorized representative who has indicated his/her understanding and acceptance.     Dental advisory given  Plan Discussed with: CRNA  Anesthesia Plan Comments:         Anesthesia Quick Evaluation

## 2018-08-26 NOTE — Anesthesia Procedure Notes (Signed)
Epidural Patient location during procedure: OB Start time: 08/26/2018 4:15 PM End time: 08/26/2018 4:30 PM  Staffing Anesthesiologist: Elmer PickerWoodrum, Chelsia Serres L, MD Performed: anesthesiologist   Preanesthetic Checklist Completed: patient identified, pre-op evaluation, timeout performed, IV checked, risks and benefits discussed and monitors and equipment checked  Epidural Patient position: sitting Prep: site prepped and draped and DuraPrep Patient monitoring: continuous pulse ox, blood pressure, heart rate and cardiac monitor Approach: midline Location: L3-L4 Injection technique: LOR air  Needle:  Needle type: Tuohy  Needle gauge: 17 G Needle length: 9 cm Needle insertion depth: 5 cm Catheter type: closed end flexible Catheter size: 19 Gauge Catheter at skin depth: 11 cm Test dose: negative  Assessment Sensory level: T8 Events: blood not aspirated, injection not painful, no injection resistance, negative IV test and no paresthesia  Additional Notes Patient identified. Risks/Benefits/Options discussed with patient including but not limited to bleeding, infection, nerve damage, paralysis, failed block, incomplete pain control, headache, blood pressure changes, nausea, vomiting, reactions to medication both or allergic, itching and postpartum back pain. Confirmed with bedside nurse the patient's most recent platelet count. Confirmed with patient that they are not currently taking any anticoagulation, have any bleeding history or any family history of bleeding disorders. Patient expressed understanding and wished to proceed. All questions were answered. Sterile technique was used throughout the entire procedure. Please see nursing notes for vital signs. Test dose was given through epidural catheter and negative prior to continuing to dose epidural or start infusion. Warning signs of high block given to the patient including shortness of breath, tingling/numbness in hands, complete motor block, or  any concerning symptoms with instructions to call for help. Patient was given instructions on fall risk and not to get out of bed. All questions and concerns addressed with instructions to call with any issues or inadequate analgesia.  Reason for block:procedure for pain

## 2018-08-26 NOTE — Progress Notes (Signed)
Spoke to [employer], CRNA  regarding patient's platelet count and her request for an epidural. Sharee Pimple discussed with patient and requested a repeat CBC per Dr. Armond Hang.

## 2018-08-26 NOTE — Progress Notes (Signed)
Patient ID: Monte Fantasia, female   DOB: 01-28-1991, 28 y.o.   MRN: [id number]  28 yo G1 39.1 weeks IOL for gestational thrombocytopenia.  BP 97/76 (BP Location: Right Arm)   Pulse (!) 123   Temp 98.2 F (36.8 C) (Oral)   Resp 18   Ht 5\' 2"  (1.575 m)   Wt 85 kg   BMI 34.29 kg/m  FHT 140s/ + accels/ no decels./ moderate variability cat I Toco rare  RN to place Cytotec 25 mcg vaginal and repeat in 4 hrs as needed if cervix not favorable f/by pitocin per protocol.  Ok for regular diet now. Change to clears with pitocin or if active labor with cytotec.   V.Carnisha Feltz, MD

## 2018-08-26 NOTE — Progress Notes (Signed)
S: Doing well, no complaints, pain well controlled with epidural  O: BP (!) 104/57   Pulse 78   Temp 98.2 F (36.8 C)   Resp 16   Ht 5\' 2"  (1.575 m)   Wt 85 kg   BMI 34.29 kg/m    FHT:  FHR: 140s bpm, variability: moderate,  accelerations:  Present,  decelerations:  Absent UC:   regular, every 3 minutes, pit at 10 munits/ min SVE:   Dilation: 1 Effacement (%): Thick Station: Ballotable Exam by:: Dr. Ernestina Penna  Cervical foley in place, vtx high  A / P:  28 y.o.  OB History  Gravida Para Term Preterm AB Living  1 0 0 0 0 0  SAB TAB Ectopic Multiple Live Births  0 0 0 0 0   at [redacted]w[redacted]d IOL, thrombocytopenia, still early, foley in place, cont to increase pitocin  - anxiety  Fetal Wellbeing:  Category I Pain Control:  Epidural  Anticipated MOD:  NSVD  Lendon Colonel 08/26/2018, 7:21 PM

## 2018-08-26 NOTE — Progress Notes (Signed)
Discussed platelet count with Dr. Ernestina Penna. Request anesthesia to see patient regarding epidural placement.

## 2018-08-26 NOTE — H&P (Signed)
Incomplete          Note outlined prior to admission, updated on am of admission. I was unable to 'complete' the incomplete H&P note and below note is copied with some changes that reflect new info since admission.  Debra Nixon is a 28 y.o. 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  On admit: Pt notes rare contractions . Good fetal movement, No vaginal bleeding, not leaking fluid.  P[facility]are at [facility] since 8 wks - Dated by LMP c/w 8 wk u/s - GBS neg - Low plts, started preg at 119, no h/o bleeding, s/p heme consult, ITP vs gest thrombocytopenia - anxiety   Prenatal Transfer Tool  Maternal Diabetes: No Genetic Screening: Normal Maternal Ultrasounds/Referrals: Normal Fetal Ultrasounds or other Referrals:  None Maternal Substance Abuse:  No Significant Maternal Medications:  None Significant Maternal Lab Results: None             OB History    Gravida  1   Para      Term      Preterm      AB      Living        SAB      TAB      Ectopic      Multiple      Live Births                 Past Medical History:  Diagnosis Date  . Anxiety   . GERD (gastroesophageal reflux disease)   . IBS (irritable bowel syndrome)   . Thrombocytopenia (HCC)         Past Surgical History:  Procedure Laterality Date  . JOINT REPLACEMENT     Family History: family history includes Breast cancer in her paternal grandmother; Melanoma in her father. Social History:  reports that she has never smoked. She has never used smokeless tobacco. She reports previous alcohol use. She reports that she does not use drugs.  Review of Systems - Negative except discomfort of pregnancy   Vitals:   08/26/18 0243 08/26/18 0403 08/26/18 0528 08/26/18 0618  BP: 107/68 108/65 116/63 114/63  Pulse: 87 69 66 65  Resp: 18 17 17 18   Temp:  98.4 F (36.9 C)    TempSrc:  Oral    Weight:      Height:         Physical Exam:    CBC    Component Value Date/Time   WBC 10.9 (H) 08/26/2018 0045   RBC 3.74 (L) 08/26/2018 0045   HGB 11.5 (L) 08/26/2018 0045   HGB 11.9 04/17/2018 1445   HCT 33.9 (L) 08/26/2018 0045   PLT 104 (L) 08/26/2018 0045   PLT 127 (L) 04/17/2018 1445   MCV 90.6 08/26/2018 0045   MCH 30.7 08/26/2018 0045   MCHC 33.9 08/26/2018 0045   RDW 13.5 08/26/2018 0045   LYMPHSABS 2.8 04/17/2018 1445   MONOABS 0.6 04/17/2018 1445   EOSABS 0.1 04/17/2018 1445   BASOSABS 0.0 04/17/2018 1445     Prenatal labs: ABO, Rh: O/Positive/-- (07/19 0000) Antibody: Negative (07/19 0000) Rubella: Immune (07/19 0000) RPR: Nonreactive (07/19 0000)  HBsAg: Negative (07/19 0000)  HIV: Non-reactive (07/19 0000)  GBS: Negative (01/17 0000)  1 hr Glucola 119          Genetic screening nl NT, nl AFP Anatomy US normal   Assessment/Plan: 28 y.o. G1P0 at [redacted]w[redacted]d IOL at term,  low plts, ITP vs gestational thrombocytopenia Iol. cytotec x 2 o/n then pitocin in am.              Lendon Colonel 08/26/2018 7:49 AM

## 2018-08-27 ENCOUNTER — Encounter (HOSPITAL_COMMUNITY)
Admission: RE | Disposition: A | Discharge status: Home / Self Care | Payer: Self-pay | Source: Home / Self Care | Attending: Obstetrics

## 2018-08-27 ENCOUNTER — Encounter (HOSPITAL_COMMUNITY): Payer: Self-pay

## 2018-08-27 LAB — CBC
HCT: 37.5 % (ref 36.0–46.0)
Hemoglobin: 12.7 g/dL (ref 12.0–15.0)
MCH: 30.8 pg (ref 26.0–34.0)
MCHC: 33.9 g/dL (ref 30.0–36.0)
MCV: 91 fL (ref 80.0–100.0)
NRBC: 0 % (ref 0.0–0.2)
Platelets: 110 10*3/uL — ABNORMAL LOW (ref 150–400)
RBC: 4.12 MIL/uL (ref 3.87–5.11)
RDW: 13.5 % (ref 11.5–15.5)
WBC: 16.3 10*3/uL — ABNORMAL HIGH (ref 4.0–10.5)

## 2018-08-27 LAB — RPR: RPR Ser Ql: NONREACTIVE

## 2018-08-27 SURGERY — Surgical Case
Anesthesia: Epidural

## 2018-08-27 MED ORDER — PHENYLEPHRINE HCL 10 MG/ML IJ SOLN
INTRAMUSCULAR | Status: DC | PRN
  Administered 2018-08-27: 80 ug via INTRAVENOUS

## 2018-08-27 MED ORDER — FENTANYL CITRATE (PF) 100 MCG/2ML IJ SOLN
INTRAMUSCULAR | Status: AC
  Filled 2018-08-27: qty 2

## 2018-08-27 MED ORDER — LACTATED RINGERS IV SOLN
INTRAVENOUS | Status: DC | PRN
  Administered 2018-08-27: 21:00:00 via INTRAVENOUS

## 2018-08-27 MED ORDER — DEXAMETHASONE SODIUM PHOSPHATE 4 MG/ML IJ SOLN
INTRAMUSCULAR | Status: AC
  Filled 2018-08-27: qty 1

## 2018-08-27 MED ORDER — HYDROMORPHONE HCL 1 MG/ML IJ SOLN: 0.2500 mg | INTRAMUSCULAR | Status: DC | PRN

## 2018-08-27 MED ORDER — OXYTOCIN 10 UNIT/ML IJ SOLN: INTRAMUSCULAR | Status: DC | PRN

## 2018-08-27 MED ORDER — DEXAMETHASONE SODIUM PHOSPHATE 4 MG/ML IJ SOLN
INTRAMUSCULAR | Status: DC | PRN
  Administered 2018-08-27: 4 mg via INTRAVENOUS

## 2018-08-27 MED ORDER — ONDANSETRON HCL 4 MG/2ML IJ SOLN: 4.0000 mg | Freq: Three times a day (TID) | INTRAMUSCULAR | Status: DC | PRN

## 2018-08-27 MED ORDER — OXYTOCIN 10 UNIT/ML IJ SOLN
INTRAMUSCULAR | Status: DC | PRN
  Administered 2018-08-27: 40 [IU]

## 2018-08-27 MED ORDER — CEFAZOLIN SODIUM-DEXTROSE 2-4 GM/100ML-% IV SOLN
INTRAVENOUS | Status: AC
  Filled 2018-08-27: qty 100

## 2018-08-27 MED ORDER — LIDOCAINE-EPINEPHRINE (PF) 2 %-1:200000 IJ SOLN
INTRAMUSCULAR | Status: AC
  Filled 2018-08-27: qty 20

## 2018-08-27 MED ORDER — SODIUM BICARBONATE 8.4 % IV SOLN
INTRAVENOUS | Status: DC | PRN
  Administered 2018-08-27: 3 mL via EPIDURAL
  Administered 2018-08-27: 4 mL via EPIDURAL
  Administered 2018-08-27: 3 mL via EPIDURAL

## 2018-08-27 MED ORDER — NALOXONE HCL 0.4 MG/ML IJ SOLN: 0.4000 mg | INTRAMUSCULAR | Status: DC | PRN

## 2018-08-27 MED ORDER — ONDANSETRON HCL 4 MG/2ML IJ SOLN: 4.0000 mg | Freq: Once | INTRAMUSCULAR | Status: DC | PRN

## 2018-08-27 MED ORDER — ACETAMINOPHEN 10 MG/ML IV SOLN: 1000.0000 mg | Freq: Once | INTRAVENOUS | Status: DC | PRN

## 2018-08-27 MED ORDER — SODIUM CHLORIDE 0.9% FLUSH: 3.0000 mL | INTRAVENOUS | Status: DC | PRN

## 2018-08-27 MED ORDER — CEFAZOLIN SODIUM-DEXTROSE 2-3 GM-%(50ML) IV SOLR
INTRAVENOUS | Status: DC | PRN
  Administered 2018-08-27: 2 g via INTRAVENOUS

## 2018-08-27 MED ORDER — FENTANYL CITRATE (PF) 100 MCG/2ML IJ SOLN
INTRAMUSCULAR | Status: DC | PRN
  Administered 2018-08-27: 100 ug via EPIDURAL

## 2018-08-27 MED ORDER — ONDANSETRON HCL 4 MG/2ML IJ SOLN
INTRAMUSCULAR | Status: AC
  Filled 2018-08-27: qty 4

## 2018-08-27 MED ORDER — OXYTOCIN 10 UNIT/ML IJ SOLN
INTRAMUSCULAR | Status: AC
  Filled 2018-08-27: qty 4

## 2018-08-27 MED ORDER — MORPHINE SULFATE (PF) 0.5 MG/ML IJ SOLN
INTRAMUSCULAR | Status: DC | PRN
  Administered 2018-08-27: 3 mg via EPIDURAL

## 2018-08-27 MED ORDER — MEPERIDINE HCL 25 MG/ML IJ SOLN: 6.2500 mg | INTRAMUSCULAR | Status: DC | PRN

## 2018-08-27 MED ORDER — OXYTOCIN 40 UNITS IN NORMAL SALINE INFUSION - SIMPLE MED: 1.0000 m[IU]/min | INTRAVENOUS | Status: DC

## 2018-08-27 MED ORDER — DEXAMETHASONE SODIUM PHOSPHATE 4 MG/ML IJ SOLN: INTRAMUSCULAR | Status: DC | PRN

## 2018-08-27 MED ORDER — PHENYLEPHRINE 40 MCG/ML (10ML) SYRINGE FOR IV PUSH (FOR BLOOD PRESSURE SUPPORT)
PREFILLED_SYRINGE | INTRAVENOUS | Status: AC
  Filled 2018-08-27: qty 100

## 2018-08-27 MED ORDER — HYDROCODONE-ACETAMINOPHEN 7.5-325 MG PO TABS: 1.0000 | ORAL_TABLET | Freq: Once | ORAL | Status: DC | PRN

## 2018-08-27 MED ORDER — TERBUTALINE SULFATE 1 MG/ML IJ SOLN: 0.2500 mg | Freq: Once | INTRAMUSCULAR | Status: DC | PRN

## 2018-08-27 MED ORDER — MORPHINE SULFATE (PF) 0.5 MG/ML IJ SOLN
INTRAMUSCULAR | Status: AC
  Filled 2018-08-27: qty 10

## 2018-08-27 SURGICAL SUPPLY — 37 items
APL SKNCLS STERI-STRIP NONHPOA (GAUZE/BANDAGES/DRESSINGS) ×1
BENZOIN TINCTURE PRP APPL 2/3 (GAUZE/BANDAGES/DRESSINGS) ×3 IMPLANT
CHLORAPREP W/TINT 26ML (MISCELLANEOUS) ×3 IMPLANT
CLAMP CORD UMBIL (MISCELLANEOUS) IMPLANT
CLOSURE STERI STRIP 1/2 X4 (GAUZE/BANDAGES/DRESSINGS) ×3 IMPLANT
CLOSURE WOUND 1/2 X4 (GAUZE/BANDAGES/DRESSINGS)
CLOTH BEACON ORANGE TIMEOUT ST (SAFETY) ×3 IMPLANT
DRSG OPSITE POSTOP 4X10 (GAUZE/BANDAGES/DRESSINGS) ×3 IMPLANT
ELECT REM PT RETURN 9FT ADLT (ELECTROSURGICAL) ×3
ELECTRODE REM PT RTRN 9FT ADLT (ELECTROSURGICAL) ×1 IMPLANT
EXTRACTOR VACUUM M CUP 4 TUBE (SUCTIONS) IMPLANT
EXTRACTOR VACUUM M CUP 4' TUBE (SUCTIONS)
GLOVE BIO SURGEON STRL SZ 6.5 (GLOVE) ×2 IMPLANT
GLOVE BIO SURGEONS STRL SZ 6.5 (GLOVE) ×1
GLOVE BIOGEL PI IND STRL 7.0 (GLOVE) ×2 IMPLANT
GLOVE BIOGEL PI INDICATOR 7.0 (GLOVE) ×4
GOWN STRL REUS W/TWL LRG LVL3 (GOWN DISPOSABLE) ×6 IMPLANT
KIT ABG SYR 3ML LUER SLIP (SYRINGE) IMPLANT
NEEDLE HYPO 22GX1.5 SAFETY (NEEDLE) IMPLANT
NEEDLE HYPO 25X5/8 SAFETYGLIDE (NEEDLE) IMPLANT
NS IRRIG 1000ML POUR BTL (IV SOLUTION) ×3 IMPLANT
PACK C SECTION WH (CUSTOM PROCEDURE TRAY) ×3 IMPLANT
PAD OB MATERNITY 4.3X12.25 (PERSONAL CARE ITEMS) ×3 IMPLANT
PENCIL SMOKE EVAC W/HOLSTER (ELECTROSURGICAL) ×3 IMPLANT
STRIP CLOSURE SKIN 1/2X4 (GAUZE/BANDAGES/DRESSINGS) IMPLANT
SUT MON AB 4-0 PS1 27 (SUTURE) ×3 IMPLANT
SUT PLAIN 0 NONE (SUTURE) IMPLANT
SUT PLAIN 2 0 XLH (SUTURE) IMPLANT
SUT VIC AB 0 CT1 36 (SUTURE) ×6 IMPLANT
SUT VIC AB 0 CTX 36 (SUTURE) ×4
SUT VIC AB 0 CTX36XBRD ANBCTRL (SUTURE) ×2 IMPLANT
SUT VIC AB 2-0 CT1 27 (SUTURE) ×2
SUT VIC AB 2-0 CT1 TAPERPNT 27 (SUTURE) ×1 IMPLANT
SYR CONTROL 10ML LL (SYRINGE) IMPLANT
TOWEL OR 17X24 6PK STRL BLUE (TOWEL DISPOSABLE) ×3 IMPLANT
TRAY FOLEY W/BAG SLVR 14FR LF (SET/KITS/TRAYS/PACK) IMPLANT
WATER STERILE IRR 1000ML POUR (IV SOLUTION) ×3 IMPLANT

## 2018-08-27 NOTE — Brief Op Note (Signed)
08/26/2018 - 08/27/2018  9:28 PM  PATIENT:  Debra Nixon  28 y.o. female  PRE-OPERATIVE DIAGNOSIS:  arrest of dilatation   POST-OPERATIVE DIAGNOSIS:  Arrst of dilatation   PROCEDURE:  Procedure(s): CESAREAN SECTION (N/A)  Primary low transverse cesarean section with 2 layer closure  SURGEON:  Surgeon(s) and Role:    * Noland FordyceFogleman, Aundrea Horace, MD - Primary    * Almquist, Candace GallusSusan E, MD - Assisting  PHYSICIAN ASSISTANT:   ASSISTANTS: As above  ANESTHESIA:   epidural  EBL:  753 mL   BLOOD ADMINISTERED:none  DRAINS: Urinary Catheter (Foley)   LOCAL MEDICATIONS USED:  NONE  SPECIMEN:  Source of Specimen:  Placenta  DISPOSITION OF SPECIMEN:  Labor and delivery  COUNTS:  YES  TOURNIQUET:  * No tourniquets in log *  DICTATION: Note written in epic  PLAN OF CARE: Admit to inpatient   PATIENT DISPOSITION:  PACU - hemodynamically stable.   Delay start of Pharmacological VTE agent (>24hrs) due to surgical blood loss or risk of bleeding: yes

## 2018-08-27 NOTE — H&P (Deleted)
S: Doing well, no complaints, pain well controlled with early epidural. Able to sleep overnight  O: BP 111/68   Pulse 61   Temp 97.9 F (36.6 C) (Oral)   Resp 16   Ht 5\' 2"  (1.575 m)   Wt 85 kg   SpO2 91%   BMI 34.29 kg/m    FHT:  FHR: 140s bpm, variability: moderate,  accelerations:  Present,  decelerations:  Absent UC:   Not tracing well, pit at 18 muits/ min SVE:   Dilation: 4 Effacement (%): 20 Station: -3 Exam by:: Suezanne Jacquet, RN  AROM, copious, clear  A / P:  28 y.o.  OB History  Gravida Para Term Preterm AB Living  1 0 0 0 0 0  SAB TAB Ectopic Multiple Live Births  0 0 0 0 0   at [redacted]w[redacted]d IOL, thrombocytopenia, slow progress, s/p cytotec x 2, pit x 24 hrs, cervical foley bulb and now AROM, not yet in active labor, continue pitocin  Fetal Wellbeing:  Category I Pain Control:  Epidural  Anticipated MOD:  NSVD  Lendon Colonel 08/27/2018, 8:46 AM

## 2018-08-27 NOTE — Addendum Note (Signed)
Addendum  created 08/27/18 2155 by Amity Roes, Doree Fudge, CRNA   Intraprocedure Event edited, Intraprocedure Staff edited

## 2018-08-27 NOTE — Op Note (Signed)
08/26/2018 - 08/27/2018  9:28 PM  PATIENT:  Debra Nixon  28 y.o. female  PRE-OPERATIVE DIAGNOSIS:  arrest of dilatation   POST-OPERATIVE DIAGNOSIS:  Arrst of dilatation   PROCEDURE:  Procedure(s): CESAREAN SECTION (N/A)  Primary low transverse cesarean section with 2 layer closure  SURGEON:  Surgeon(s) and Role:    * Noland Fordyce, MD - Primary    * Almquist, Candace Gallus, MD - Assisting  PHYSICIAN ASSISTANT:   ASSISTANTS: As above  ANESTHESIA:   epidural  EBL:  753 mL   BLOOD ADMINISTERED:none  DRAINS: Urinary Catheter (Foley)   LOCAL MEDICATIONS USED:  NONE  SPECIMEN:  Source of Specimen:  Placenta  DISPOSITION OF SPECIMEN:  Labor and delivery  COUNTS:  YES  TOURNIQUET:  * No tourniquets in log *  DICTATION: Note written in epic  PLAN OF CARE: Admit to inpatient   PATIENT DISPOSITION:  PACU - hemodynamically stable.   Delay start of Pharmacological VTE agent (>24hrs) due to surgical blood loss or risk of bleeding: yes     Findings:  @BABYSEXEBC @ infant,  APGAR (1 MIN):   APGAR (5 MINS):   APGAR (10 MINS):   Normal uterus, normal placenta. 3VC, clear amniotic fluid  EBL: Per nursing notes cc Antibiotics:   2g Ancef Complications: none  Indications: This is a 28 y.o. year-old, G1 at [redacted]w[redacted]d admitted for induction of labor at term due to thrombocytopenia.  Given unfavorable cervix patient was admitted for overnight ripening with Cytotec x2.  In the a.m. her cervix was still unfavorable and the cervical Foley balloon was placed.  Pitocin was started.  Patient received an early epidural due to pain intolerance, anxiety and thrombocytopenia.  Despite increasing doses of Pitocin the Foley bulb stayed in for about 18 hours.  Several hours later artificial rupture of membranes was done.  Patient continued to have no further cervical dilation or effacement.  IUPC was placed and despite Pitocin up to 30 milliunits/min contractions were inadequate.  A 2-hour Pitocin  break was given and Pitocin then was then resumed but patient unable to achieve adequate [place] units.  No fetal descent or cervical dilation was noted.  Patient requested cesarean section.. Risks benefits and alternatives of the procedure were discussed with the patient who agreed to proceed  Procedure:  After informed consent was obtained the patient was taken to the operating room where epidural anesthesia was found to be adequate.  She was prepped and draped in the normal sterile fashion in dorsal supine position with a leftward tilt.  A foley catheter was in place.  A Pfannenstiel skin incision was made 2 cm above the pubic symphysis in the midline with the scalpel.  Dissection was carried down with the Bovie cautery until the fascia was reached. The fascia was incised in the midline. The incision was extended laterally with the Mayo scissors. The inferior aspect of the fascial incision was grasped with the Coker clamps, elevated up and the underlying rectus muscles were dissected off sharply. The superior aspect of the fascial incision was grasped with the Coker clamps elevated up and the underlying rectus muscles were dissected off sharply.  The peritoneum was entered bluntly. The peritoneal incision was extended superiorly and inferiorly with good visualization of the bladder.  A bladder flap was created sharply. the bladder blade was inserted and palpation was done to assess the fetal position and the location of the uterine vessels. The lower segment of the uterus was incised sharply with the scalpel and  extended  bluntly in the cephalo-caudal fashion. The infant was grasped, brought to the incision,  rotated and the infant was delivered with fundal pressure. The nose and mouth were bulb suctioned. The cord was clamped and cut after 45 seconds delay. The infant was handed off to the waiting pediatrician. The placenta was expressed. The uterus was left in situ. The uterus was cleared of all clots and  debris. The uterine incision was repaired with 0 Vicryl in a running locked fashion.  A second layer of the same suture was used in an imbricating fashion to obtain excellent hemostasis.  the gutters were cleared of all clots and debris. The uterine incision was reinspected and found to be hemostatic. The peritoneum was grasped and closed with 2-0 Vicryl in a running fashion. The cut muscle edges and the underside of the fascia were inspected and found to be hemostatic. The fascia was closed with 0 Vicryl in a single layer . The subcutaneous tissue was irrigated. Scarpa's layer was closed with a 2-0 plain gut suture. The skin was closed with a 4-0 Monocryl in a single layer. The patient tolerated the procedure well. Sponge lap and needle counts were correct x3 and patient was taken to the recovery room in a stable condition.  Lendon Colonel 08/27/2018 9:33 PM

## 2018-08-27 NOTE — Progress Notes (Signed)
S: Doing well, no complaints, pain well controlled with early epidural. Able to sleep overnight  O: BP 111/68   Pulse 61   Temp 97.9 F (36.6 C) (Oral)   Resp 16   Ht 5' 2" (1.575 m)   Wt 85 kg   SpO2 91%   BMI 34.29 kg/m    FHT:  FHR: 140s bpm, variability: moderate,  accelerations:  Present,  decelerations:  Absent UC:   Not tracing well, pit at 18 muits/ min SVE:   Dilation: 4 Effacement (%): 20 Station: -3 Exam by:: CJ Williams, RN  AROM, copious, clear  Debra / P:  27 y.o.  OB History  Gravida Para Term Preterm AB Living  1 0 0 0 0 0  SAB TAB Ectopic Multiple Live Births  0 0 0 0 0   at [redacted]w[redacted]d IOL, thrombocytopenia, slow progress, s/p cytotec x 2, pit x 24 hrs, cervical foley bulb and now AROM, not yet in active labor, continue pitocin  Fetal Wellbeing:  Category I Pain Control:  Epidural  Anticipated MOD:  NSVD  Debra Nixon Debra Nixon 08/27/2018, 8:46 AM  

## 2018-08-27 NOTE — Transfer of Care (Signed)
Immediate Anesthesia Transfer of Care Note  Patient: Debra Nixon  Procedure(s) Performed: CESAREAN SECTION (N/A )  Patient Location: PACU  Anesthesia Type:Epidural  Level of Consciousness: awake, alert  and oriented  Airway & Oxygen Therapy: Patient Spontanous Breathing  Post-op Assessment: Report given to RN and Post -op Vital signs reviewed and stable  Post vital signs: Reviewed and stableHR 73, RR 20, SaO2 99%, BP 109/60  Last Vitals:  Vitals Value Taken Time  BP 109/60 08/27/2018  9:49 PM  Temp    Pulse 77 08/27/2018  9:51 PM  Resp 19 08/27/2018  9:51 PM  SpO2 100 % 08/27/2018  9:51 PM  Vitals shown include unvalidated device data.  Last Pain:  Vitals:   08/27/18 1700  TempSrc:   PainSc: 0-No pain         Complications: No apparent anesthesia complications

## 2018-08-27 NOTE — Addendum Note (Signed)
Addendum  created 08/27/18 2134 by Teya Otterson, Doree Fudgeolleen S, CRNA   Attestation recorded in Intraprocedure, Intraprocedure Attestations filed, Intraprocedure Event edited, Intraprocedure Flowsheets edited, Intraprocedure Meds edited, Intraprocedure Staff edited, Patient device removed

## 2018-08-27 NOTE — Anesthesia Postprocedure Evaluation (Signed)
Anesthesia Post Note  Patient: Debra Nixon  Procedure(s) Performed: AN AD HOC LABOR EPIDURAL     Anesthesia Post Evaluation  Last Vitals:  Vitals:   08/27/18 0500 08/27/18 0531  BP: 93/63 100/66  Pulse: 61 (!) 59  Resp: 17   Temp:  36.6 C  SpO2:      Last Pain:  Vitals:   08/27/18 0531  TempSrc: Oral  PainSc:    Pain Goal:                   [name]

## 2018-08-27 NOTE — Addendum Note (Signed)
Addendum  created 08/27/18 2154 by Jorje Vanatta, Doree Fudge, CRNA   Charge Capture section accepted, Clinical Note Signed, Intraprocedure Event edited

## 2018-08-27 NOTE — Addendum Note (Signed)
Addendum  created 08/27/18 2155 by Trevor Iha, MD   Intraprocedure Staff edited, Order list changed, Order sets accessed

## 2018-08-27 NOTE — Progress Notes (Signed)
Stop Pitocin for 2 hours and restart with dose of 4x4.

## 2018-08-27 NOTE — Addendum Note (Signed)
Addendum  created 08/27/18 2043 by Jaylnn Ullery, Doree Fudge, CRNA   Intraprocedure Event deleted, Intraprocedure Event edited, Intraprocedure Flowsheets edited, Intraprocedure Meds edited, Patient device added

## 2018-08-27 NOTE — Addendum Note (Signed)
Addendum  created 08/27/18 2032 by Trevor IhaHouser, Stephen A, MD   Intraprocedure Event edited

## 2018-08-28 ENCOUNTER — Encounter (HOSPITAL_COMMUNITY): Payer: Self-pay | Admitting: Obstetrics

## 2018-08-28 LAB — CBC
HCT: 31.1 % — ABNORMAL LOW (ref 36.0–46.0)
HEMOGLOBIN: 10.7 g/dL — AB (ref 12.0–15.0)
MCH: 30.9 pg (ref 26.0–34.0)
MCHC: 34.4 g/dL (ref 30.0–36.0)
MCV: 89.9 fL (ref 80.0–100.0)
Platelets: 95 10*3/uL — ABNORMAL LOW (ref 150–400)
RBC: 3.46 MIL/uL — AB (ref 3.87–5.11)
RDW: 13.4 % (ref 11.5–15.5)
WBC: 18.3 10*3/uL — ABNORMAL HIGH (ref 4.0–10.5)
nRBC: 0 % (ref 0.0–0.2)

## 2018-08-28 MED ORDER — NALBUPHINE HCL 10 MG/ML IJ SOLN: 5.0000 mg | Freq: Once | INTRAMUSCULAR | Status: DC | PRN

## 2018-08-28 MED ORDER — LACTATED RINGERS IV SOLN: INTRAVENOUS | Status: DC

## 2018-08-28 MED ORDER — NALBUPHINE HCL 10 MG/ML IJ SOLN: 5.0000 mg | INTRAMUSCULAR | Status: DC | PRN

## 2018-08-28 MED ORDER — PANTOPRAZOLE SODIUM 40 MG PO TBEC
40.0000 mg | DELAYED_RELEASE_TABLET | Freq: Every day | ORAL | Status: DC
  Administered 2018-08-29: 40 mg via ORAL
  Filled 2018-08-28: qty 1

## 2018-08-28 MED ORDER — DIPHENHYDRAMINE HCL 25 MG PO CAPS: 25.0000 mg | ORAL_CAPSULE | ORAL | Status: DC | PRN

## 2018-08-28 MED ORDER — TETANUS-DIPHTH-ACELL PERTUSSIS 5-2.5-18.5 LF-MCG/0.5 IM SUSP: 0.5000 mL | Freq: Once | INTRAMUSCULAR | Status: DC

## 2018-08-28 MED ORDER — DIPHENHYDRAMINE HCL 25 MG PO CAPS: 25.0000 mg | ORAL_CAPSULE | Freq: Four times a day (QID) | ORAL | Status: DC | PRN

## 2018-08-28 MED ORDER — LORATADINE 10 MG PO TABS
10.0000 mg | ORAL_TABLET | Freq: Every day | ORAL | Status: DC
  Administered 2018-08-30: 10 mg via ORAL
  Filled 2018-08-28 (×3): qty 1

## 2018-08-28 MED ORDER — IBUPROFEN 800 MG PO TABS
800.0000 mg | ORAL_TABLET | Freq: Three times a day (TID) | ORAL | Status: DC
  Administered 2018-08-28: 800 mg via ORAL
  Filled 2018-08-28: qty 1

## 2018-08-28 MED ORDER — SIMETHICONE 80 MG PO CHEW
80.0000 mg | CHEWABLE_TABLET | ORAL | Status: DC
  Administered 2018-08-28 – 2018-08-29 (×3): 80 mg via ORAL
  Filled 2018-08-28 (×3): qty 1

## 2018-08-28 MED ORDER — KETOROLAC TROMETHAMINE 30 MG/ML IJ SOLN: 30.0000 mg | Freq: Four times a day (QID) | INTRAMUSCULAR | Status: DC | PRN

## 2018-08-28 MED ORDER — COCONUT OIL OIL
1.0000 "application " | TOPICAL_OIL | Status: DC | PRN
  Filled 2018-08-28: qty 120

## 2018-08-28 MED ORDER — PRENATAL MULTIVITAMIN CH
1.0000 | ORAL_TABLET | Freq: Every day | ORAL | Status: DC
  Administered 2018-08-29: 1 via ORAL
  Filled 2018-08-28 (×2): qty 1

## 2018-08-28 MED ORDER — WITCH HAZEL-GLYCERIN EX PADS: 1.0000 "application " | MEDICATED_PAD | CUTANEOUS | Status: DC | PRN

## 2018-08-28 MED ORDER — MENTHOL 3 MG MT LOZG: 1.0000 | LOZENGE | OROMUCOSAL | Status: DC | PRN

## 2018-08-28 MED ORDER — NALOXONE HCL 4 MG/10ML IJ SOLN
1.0000 ug/kg/h | INTRAVENOUS | Status: DC | PRN
  Filled 2018-08-28: qty 5

## 2018-08-28 MED ORDER — OXYTOCIN 40 UNITS IN NORMAL SALINE INFUSION - SIMPLE MED: 2.5000 [IU]/h | INTRAVENOUS | Status: DC

## 2018-08-28 MED ORDER — OXYCODONE-ACETAMINOPHEN 5-325 MG PO TABS: 1.0000 | ORAL_TABLET | ORAL | Status: DC | PRN

## 2018-08-28 MED ORDER — SIMETHICONE 80 MG PO CHEW: 80.0000 mg | CHEWABLE_TABLET | ORAL | Status: DC | PRN

## 2018-08-28 MED ORDER — DIBUCAINE 1 % RE OINT: 1.0000 "application " | TOPICAL_OINTMENT | RECTAL | Status: DC | PRN

## 2018-08-28 MED ORDER — SIMETHICONE 80 MG PO CHEW
80.0000 mg | CHEWABLE_TABLET | Freq: Three times a day (TID) | ORAL | Status: DC
  Administered 2018-08-28 – 2018-08-30 (×5): 80 mg via ORAL
  Filled 2018-08-28 (×6): qty 1

## 2018-08-28 MED ORDER — SCOPOLAMINE 1 MG/3DAYS TD PT72
1.0000 | MEDICATED_PATCH | Freq: Once | TRANSDERMAL | Status: DC
  Filled 2018-08-28: qty 1

## 2018-08-28 MED ORDER — OXYCODONE HCL 5 MG PO TABS
5.0000 mg | ORAL_TABLET | ORAL | Status: DC | PRN
  Administered 2018-08-28: 5 mg via ORAL
  Filled 2018-08-28: qty 1

## 2018-08-28 MED ORDER — IBUPROFEN 600 MG PO TABS
600.0000 mg | ORAL_TABLET | Freq: Four times a day (QID) | ORAL | Status: DC | PRN
  Administered 2018-08-29 – 2018-08-30 (×3): 600 mg via ORAL
  Filled 2018-08-28 (×3): qty 1

## 2018-08-28 MED ORDER — SENNOSIDES-DOCUSATE SODIUM 8.6-50 MG PO TABS
2.0000 | ORAL_TABLET | ORAL | Status: DC
  Administered 2018-08-28 – 2018-08-29 (×3): 2 via ORAL
  Filled 2018-08-28 (×3): qty 2

## 2018-08-28 MED ORDER — ACETAMINOPHEN 500 MG PO TABS
500.0000 mg | ORAL_TABLET | ORAL | Status: DC | PRN
  Administered 2018-08-28 – 2018-08-29 (×4): 500 mg via ORAL
  Filled 2018-08-28 (×6): qty 1

## 2018-08-28 MED ORDER — DIPHENHYDRAMINE HCL 50 MG/ML IJ SOLN: 12.5000 mg | INTRAMUSCULAR | Status: DC | PRN

## 2018-08-28 NOTE — Progress Notes (Signed)
CSW acknowledged consult for edinburgh score 11. CSW met with [name] today and completed assessment. CSW provided perinatal mood and anxiety disorder education, discussed treatment and gave resources for mental health follow up if concerns arise.    CSW signing off. CSW identifies no further need for intervention and no barriers to discharge at this time.  Kenyen Candy, LCSWA Clinical Social Worker [facility] Cell#: [phone] 

## 2018-08-28 NOTE — Addendum Note (Signed)
Addendum  created 08/28/18 5859 by Cleda Clarks, CRNA   Clinical Note Signed

## 2018-08-28 NOTE — Anesthesia Postprocedure Evaluation (Signed)
Anesthesia Post Note  Patient: Debra Nixon  Procedure(s) Performed: CESAREAN SECTION (N/A )     Patient location during evaluation: Mother Baby Anesthesia Type: Epidural Level of consciousness: awake, awake and alert and oriented Pain management: pain level controlled Vital Signs Assessment: post-procedure vital signs reviewed and stable Respiratory status: spontaneous breathing Cardiovascular status: blood pressure returned to baseline Postop Assessment: no headache, no backache, epidural receding, patient able to bend at knees, no apparent nausea or vomiting, adequate PO intake and able to ambulate Anesthetic complications: no    Last Vitals:  Vitals:   08/28/18 0400 08/28/18 0800  BP: 94/69 98/63  Pulse: (!) 59 60  Resp: 16 17  Temp: 36.8 C 36.6 C  SpO2: 98% 97%    Last Pain:  Vitals:   08/28/18 0800  TempSrc: Oral  PainSc:    Pain Goal:                   Cleda ClarksBrowder, Marializ Ferrebee R

## 2018-08-28 NOTE — Progress Notes (Signed)
CSW received consult for hx of Anxiety.  CSW met with [name] to offer support and complete assessment.    CSW met with [name] at bedside to discuss consult for history of anxiety, [name] was accompanied by FOB. CSW asked FOB to leave during assessment with [name]'s permission, FOB left voluntarily. CSW introduced self and explained reason for consult. [name] was welcoming and remained engaged throughout assessment. CSW inquired about [name]'s mental health history, [name] reported that she was diagnosed with generalized anxiety disorder at 28 years old and that she has also been diagnosed with Obsessive Compulsive Disorder. [name] reported that her symptoms are excessive worrying, racing thoughts and checking on things. [name] reported that prior to pregnancy she was taking supplements to treat her anxiety and her Debra Nixon term goal is to not take any medications. CSW and [name] discussed [name]'s coping skills to treat symptoms of anxiety, [name] reported that she uses affirmations, writing and talking to people to defer her attention. CSW praised [name] for healthy coping skills. [name] reported that she knows she will have some worry being a new mom but is trying to find ways to lessen her worrying. CSW positively affirmed [name] being proactive to try to decrease her worrying. CSW inquired about [name]'s support system, [name] reported that her husband/FOB is great along with her mother and sister. [name] reported that her sister is a mental health counselor. CSW asked  [name] how she was feeling, [name] reported that she feels like a weight is lifted off her shoulder now that her baby is here and healthy and she is also healthy. CSW validated and affirmed [name]'s feelings. [name] presented calm and was talkative during assessment. [name] possessed a great amount of insight about her mental health and ways to be proactive to treat/minimize symptoms of anxiety/OCD. [name] did not demonstrate any acute mental health signs/symptoms. CSW assessed for safety, [name] denied SI, HI and  domestic violence.   CSW provided education regarding the baby blues period vs. perinatal mood disorders, discussed treatment and gave resources for mental health follow up if concerns arise.  CSW recommends self-evaluation during the postpartum time period using the New Mom Checklist from Postpartum Progress and encouraged [name] to contact a medical professional if symptoms are noted at any time.    [name] thanked CSW for visit and coming to speak about PPD.   CSW identifies no further need for intervention and no barriers to discharge at this time.  Debra Nixon, LCSWA Clinical Social Worker [facility] Cell#: [phone]  

## 2018-08-28 NOTE — Addendum Note (Signed)
Addendum  created 08/28/18 0038 by Trevor Iha, MD   Attestation recorded in Intraprocedure, Intraprocedure Attestations filed

## 2018-08-28 NOTE — Lactation Note (Signed)
This note was copied from a baby's chart. Lactation Consultation Note  Patient Name: Boy Mikeisha Nixdorf AQTMA'U Date: 08/28/2018 Reason for consult: Initial assessment;Term P1, 5 hour female infant, mom had c/s  delivery.  Per mom she previous breastfeed infant prior to [facility] entering her room for 10 minutes. Per parents infant had one void since delivery. Per mom, she had Spectra 2 DEBP at home. Per mom, she was given a harmony hand pump by nurse. Mom demonstrated hand expression and easily expressed 5 ml of colostrum that she plans to offer to infant at next feeding. Mom will breastfeed infant first then offer EBM at next feeding. LC noticed mom has short shafted nipples, LC explained to mom how to wear breast shells and mom knows not to sleep in them at night.  Mom will do breast stimulation, hand expression or pre-pumping prior to latching infant to breast.   Mom knows to breastfeed infant according hunger cues and not to exceed 3 hours without breastfeeding infant. LC discussed I & O. Reviewed Baby & Me book's Breastfeeding Basics.  Mom knows to call Nurse or LC if she has any further questions, concerns or need assistance with latching infant to breast. Mom made aware of O/P services, breastfeeding support groups, community resources, and our phone # for post-discharge questions.   Maternal Data Formula Feeding for Exclusion: No Has patient been taught Hand Expression?: Yes(Mom hand expressed 5 ml of colostrum for future feeding .) Does the patient have breastfeeding experience prior to this delivery?: No  Feeding Feeding Type: Breast Fed  LATCH Score Latch: Grasps breast easily, tongue down, lips flanged, rhythmical sucking.  Audible Swallowing: A few with stimulation  Type of Nipple: Flat  Comfort (Breast/Nipple): Soft / non-tender  Hold (Positioning): Assistance needed to correctly position infant at breast and maintain latch.  LATCH Score: 7  Interventions Interventions:  Breast feeding basics reviewed;Skin to skin;Hand express;Pre-pump if needed;Hand pump  Lactation Tools Discussed/Used Tools: Shells;Pump Shell Type: Other (comment)(short shafted) Breast pump type: Manual WIC Program: No Pump Review: Setup, frequency, and cleaning;Milk Storage Initiated by:: by Nurse Date initiated:: 08/28/18   Consult Status Consult Status: Follow-up Date: 08/28/18 Follow-up type: In-patient    Danelle Earthly 08/28/2018, 3:01 AM

## 2018-08-28 NOTE — Progress Notes (Addendum)
POSTOPERATIVE DAY # 1 S/P CS - arrest of dilation prior to active labor  S:         Reports feeling well              Tolerating po intake / no nausea / no vomiting / + flatus / no BM             Bleeding is light             Pain controlled with long-acting narcotic / aware Motrin held due to low platelet             Up ad lib / not ambulatory except to BR / voiding QS  Newborn Breast / working with latch (flat nipples)  O:  VS: BP 98/63 (BP Location: Left Arm)   Pulse 60   Temp 97.8 F (36.6 C) (Oral)   Resp 17   Ht 5\' 2"  (1.575 m)   Wt 85 kg   SpO2 97%   BMI 34.29 kg/m    LABS:              Recent Labs    08/27/18 1839 08/28/18 0535  WBC 16.3* 18.3*  HGB 12.7 10.7*  PLT 110* 95*               Bloodtype: --/--/O NEG (02/03 0047) / newborn O negative - rhogam not indicated  Rubella: Immune (07/19 0000)                                         I&O: Intake/Output      02/04 0701 - 02/05 0700 02/05 0701 - 02/06 0700   I.V. (mL/kg) 800 (9.4)    Total Intake(mL/kg) 800 (9.4)    Urine (mL/kg/hr) 5625 (2.8)    Blood 753    Total Output 6378    Net -5578                   Physical Exam:             Alert and Oriented X3  Lungs: Clear and unlabored  Heart: regular rate and rhythm / no mumurs  Abdomen: soft, non-tender, mildly distended             Fundus: firm, non-tender, Ueven             Dressing intact honeycomb              Incision:  approximated with suture / no erythema / no ecchymosis / no drainage  Extremities: 1+ lower extremity edema, no calf pain or tenderness, negative Homans  A:        POD # 1  S/P CS - arrest of dilation - latent labor            Thrombocytopenia less than 100  P:        Routine postoperative care              Hold motrin x 24 hours - restart in AM / add Tylenol today             No rhogam indicated - newborn RH negative    Marlinda Mike CNM, MSN, [facility] 08/28/2018, 8:12 AM

## 2018-08-29 LAB — CBC
HCT: 29.6 % — ABNORMAL LOW (ref 36.0–46.0)
Hemoglobin: 9.8 g/dL — ABNORMAL LOW (ref 12.0–15.0)
MCH: 30.1 pg (ref 26.0–34.0)
MCHC: 33.1 g/dL (ref 30.0–36.0)
MCV: 90.8 fL (ref 80.0–100.0)
Platelets: 101 10*3/uL — ABNORMAL LOW (ref 150–400)
RBC: 3.26 MIL/uL — ABNORMAL LOW (ref 3.87–5.11)
RDW: 13.6 % (ref 11.5–15.5)
WBC: 12.2 10*3/uL — ABNORMAL HIGH (ref 4.0–10.5)
nRBC: 0 % (ref 0.0–0.2)

## 2018-08-29 LAB — TYPE AND SCREEN
ABO/RH(D): O NEG
Antibody Screen: POSITIVE
Unit division: 0
Unit division: 0

## 2018-08-29 LAB — BPAM RBC
Blood Product Expiration Date: 202002142359
Blood Product Expiration Date: 202002172359
ISSUE DATE / TIME: 202002061819
ISSUE DATE / TIME: 202002062158
UNIT TYPE AND RH: 9500
Unit Type and Rh: 9500

## 2018-08-29 MED ORDER — MAGNESIUM OXIDE 400 (241.3 MG) MG PO TABS
400.0000 mg | ORAL_TABLET | Freq: Every day | ORAL | Status: DC
  Administered 2018-08-29 – 2018-08-30 (×2): 400 mg via ORAL
  Filled 2018-08-29 (×2): qty 1

## 2018-08-29 MED ORDER — POLYSACCHARIDE IRON COMPLEX 150 MG PO CAPS
150.0000 mg | ORAL_CAPSULE | Freq: Every day | ORAL | Status: DC
  Administered 2018-08-29 – 2018-08-30 (×2): 150 mg via ORAL
  Filled 2018-08-29 (×3): qty 1

## 2018-08-29 NOTE — Progress Notes (Signed)
RN entered room to check on patient. Patient stated that blurry vision has resolved. RN reminded patient to notify if anything changes. Patient voiced understanding and stated she was going to try to sleep now.

## 2018-08-29 NOTE — Progress Notes (Addendum)
POSTOPERATIVE DAY # 2 S/P Primary LTCS for arrest of dilation prior to active labor, baby boy   S:         Reports feeling better today             Tolerating po intake / no nausea / no vomiting / + flatus / no BM  Denies dizziness, SOB, or CP             Bleeding is light             Pain somewhat controlled with Tylenol, but did not like the way she felt with Oxycodone IR - not on Motrin due to low platelets              Up ad lib / ambulatory/ voiding QS  Newborn breast feeding - going well per patient  / Circumcision - planning   O:  VS: BP 99/64 (BP Location: Right Arm)   Pulse 64   Temp 97.6 F (36.4 C) (Oral)   Resp 16   Ht 5\' 2"  (1.575 m)   Wt 85 kg   SpO2 97%   BMI 34.29 kg/m    LABS:               Recent Labs    08/27/18 1839 08/28/18 0535  WBC 16.3* 18.3*  HGB 12.7 10.7*  PLT 110* 95*               Bloodtype: --/--/O NEG (02/03 0047)  Rubella: Immune (07/19 0000)                                             I&O: Intake/Output      02/05 0701 - 02/06 0700 02/06 0701 - 02/07 0700   I.V. (mL/kg)     Total Intake(mL/kg)     Urine (mL/kg/hr) 1050 (0.5)    Blood     Total Output 1050    Net -1050                      Physical Exam:             Alert and Oriented X3  Lungs: Clear and unlabored  Heart: regular rate and rhythm / no murmurs  Abdomen: soft, non-tender, non-distended, active bowel sounds in all quadrants             Fundus: firm, non-tender, U-2             Dressing: honeycomb with steri-strips c/d/i              Incision:  approximated with sutures / no erythema / no ecchymosis / no drainage  Perineum: intact  Lochia: small, no clots   Extremities: +2 pitting ankle and pedal edema edema, no calf pain or tenderness, SCDs on   A:        POD # 2 S/P Primary LTCS            RH Negative - baby RH negative - no Rhogam indicated   Gestational Thrombocytopenia - platelets were less than 100 on 2/5  ABL Anemia   P:        Routine postoperative  care              Repeat CBC today   If platelets above 100 will plan to restart Motrin  See lactation today  Encouraged ambulation   Circ prior to discharge   Anticipate discharge home tomorrow  Carlean Jews, MSN, CNM [facility]   Addendum: Begin Niferex 150mg  PO daily Magnesium oxide 400mg  PO daily  Platelets 101 - okay to resume Motrin 600mg  every 6 hrs PRN for pain  Carlean Jews, CNM

## 2018-08-29 NOTE — Progress Notes (Signed)
Pt called RN to room stating vision in right eye was blurry. Denies headache, SOB, or nausea. Vital signs stable with BP 111/69, HR 77 and O2 sat of 98. Fundal massage performed. Fundus U1 and firm with scant bleeding. Midwife called and notified. Instructed to monitor patient and see if it resolved with sleep. RN relayed this information to patient and instructed to patient to notify RN immediately if she noticed any changes or worsening of symptoms. Patient expressed understanding.

## 2018-08-29 NOTE — Lactation Note (Addendum)
This note was copied from a baby's chart. Lactation Consultation Note  Patient Name: Debra Nixon XQJJH'E Date: 08/29/2018 Reason for consult: Follow-up assessment;Term;Nipple pain/trauma(baby for circ )  Baby is 9 hours old and in the tx nursery for circ. 4% weight loss  LC explained to mom potential sleepiness after the circ / and the benefits of STS if the  Baby is sleepy.  Per mom I was able to latch the baby all by myself 9:10 and the baby fed for 25 mins with swallows.  Per mom nipples are sore / using shells and comfort gels already.  LC assessed with mom permission and recommended due the areola bruising/ and the sore nipples The following steps prior to latching  And use of breast feeding tools :  Prior to latch - breast massage , hand express ( reviewed by [facility] ), and pre - pump with hand pump to make the  Nipple areola complex more elastic/ reverse pressure/ latch with firm support and compressions.  LC showed mom how far the baby should latch over the nipple and onto the areola to decrease further soreness.  Mom aware to call if needed.  Comfort gels after feedings alternating with shells except when sleeping.      Maternal Data Has patient been taught Hand Expression?: Yes  Feeding Feeding Type: Breast Fed  LATCH Score                   Interventions Interventions: Breast feeding basics reviewed  Lactation Tools Discussed/Used Tools: Shells;Pump;Flanges;Comfort gels Flange Size: 27;24 Shell Type: Inverted Breast pump type: Manual Pump Review: Setup, frequency, and cleaning   Consult Status Consult Status: Follow-up Date: 08/30/18 Follow-up type: In-patient    Matilde Sprang [name] 08/29/2018, 12:02 PM

## 2018-08-30 DIAGNOSIS — O9902 Anemia complicating childbirth: Secondary | ICD-10-CM | POA: Diagnosis present

## 2018-08-30 MED ORDER — MAGNESIUM OXIDE 400 (241.3 MG) MG PO TABS: 400.0000 mg | ORAL_TABLET | Freq: Every day | ORAL | Status: DC

## 2018-08-30 MED ORDER — IBUPROFEN 600 MG PO TABS: 600.0000 mg | ORAL_TABLET | Freq: Four times a day (QID) | ORAL | 0 refills | Status: DC | PRN

## 2018-08-30 MED ORDER — POLYSACCHARIDE IRON COMPLEX 150 MG PO CAPS: 150.0000 mg | ORAL_CAPSULE | Freq: Every day | ORAL | Status: DC

## 2018-08-30 MED ORDER — COCONUT OIL OIL: 1.0000 "application " | TOPICAL_OIL | 0 refills | Status: DC | PRN

## 2018-08-30 MED ORDER — OXYCODONE HCL 5 MG PO TABS: 5.0000 mg | ORAL_TABLET | ORAL | 0 refills | Status: DC | PRN

## 2018-08-30 MED ORDER — SIMETHICONE 80 MG PO CHEW: 80.0000 mg | CHEWABLE_TABLET | ORAL | 0 refills | Status: DC | PRN

## 2018-08-30 NOTE — Discharge Instructions (Signed)
For swelling: nettle and dandelion tinctures (follw directions on bottles), maintain hydration 8-10 cups water / day minimal (can find at health food stores - [employer], [employer], [employer]) °Add daily alfalfa 2000 mg tabs for iron absorption and boosting milk production °

## 2018-08-30 NOTE — Discharge Summary (Signed)
OB Discharge Summary  Patient Name: Debra Nixon DOB: [birth year] MRN: [id number]  Date of admission: 08/26/2018 Delivering provider: Noland Fordyce   Date of discharge: 08/30/2018  Admitting diagnosis: INDUCTION Intrauterine pregnancy: [redacted]w[redacted]d     Secondary diagnosis:Principal Problem:   Postpartum care following cesarean delivery (2/4) Active Problems:   Thrombocytopenia affecting pregnancy (HCC)   Arrest of dilation, delivered, current hospitalization   Maternal anemia, with delivery  Additional problems:none     Discharge diagnosis:  Patient Active Problem List   Diagnosis Date Noted  . Maternal anemia, with delivery 08/30/2018  . Postpartum care following cesarean delivery (2/4) 08/28/2018  . Arrest of dilation, delivered, current hospitalization 08/28/2018  . Thrombocytopenia affecting pregnancy (HCC) 08/26/2018  . Chronic GERD 04/17/2018  . Irritable bowel syndrome 04/17/2018                                                                Post partum procedures: none  Augmentation: AROM, Pitocin, Cytotec and Foley Balloon Pain control: Epidural  Laceration:None  Episiotomy:None  Complications: None   Hospital course:  Induction of Labor With Cesarean Section  28 y.o. yo G1P0 at [redacted]w[redacted]d was admitted to the hospital 08/26/2018 for induction of labor due to thrombocytopenia. Patient had a labor course significant for protracted labor course not responsive to various uterontonics. The patient went for cesarean section due to Arrest of Dilation, and delivered a Viable infant,08/27/2018  Membrane Rupture Time/Date: 8:32 AM ,08/27/2018   Details of operation can be found in separate operative Note.  Patient had an uncomplicated postpartum course. She is ambulating, tolerating a regular diet, passing flatus, and urinating well.  Patient is discharged home in stable condition on 08/30/18.                                    Physical exam  Vitals:   08/29/18 0800 08/29/18 1449  08/29/18 2153 08/30/18 0519  BP: 100/68 108/64 (!) 102/51 94/80  Pulse: 66 69 68 83  Resp: 16 16 16 20   Temp: 98.1 F (36.7 C) 97.6 F (36.4 C)  97.9 F (36.6 C)  TempSrc: Oral Oral  Oral  SpO2: 98%     Weight:      Height:       General: alert, cooperative and no distress Lochia: appropriate Uterine Fundus: firm Incision: Dressing is clean, dry, and intact DVT Evaluation: No cords or calf tenderness. Calf/Ankle edema is present Labs: Lab Results  Component Value Date   WBC 12.2 (H) 08/29/2018   HGB 9.8 (L) 08/29/2018   HCT 29.6 (L) 08/29/2018   MCV 90.8 08/29/2018   PLT 101 (L) 08/29/2018   CMP Latest Ref Rng & Units 04/17/2018  Glucose 70 - 99 mg/dL 84  BUN 6 - 20 mg/dL 9  Creatinine 5.63 - 8.93 mg/dL 7.34  Sodium 287 - 681 mmol/L 138  Potassium 3.5 - 5.1 mmol/L 4.0  Chloride 98 - 111 mmol/L 106  CO2 22 - 32 mmol/L 25  Calcium 8.9 - 10.3 mg/dL 9.0  Total Protein 6.5 - 8.1 g/dL 6.7  Total Bilirubin 0.3 - 1.2 mg/dL 0.3  Alkaline Phos 38 - 126 U/L 81  AST 15 - 41 U/L 19  ALT 0 - 44 U/L 24    Vaccines: TDaP UTD         Flu    UTD  Discharge instruction: per After Visit Summary and "Baby and Me Booklet".  After Visit Meds:  Allergies as of 08/30/2018   No Known Allergies     Medication List    TAKE these medications   acetaminophen 325 MG tablet Commonly known as:  TYLENOL Take 650 mg by mouth every 6 (six) hours as needed for mild pain or headache.   AIRBORNE Tbef Take 1 tablet by mouth 2 (two) times a week. For cold prevention   coconut oil Oil Apply 1 application topically as needed.   ibuprofen 600 MG tablet Commonly known as:  ADVIL,MOTRIN Take 1 tablet (600 mg total) by mouth every 6 (six) hours as needed for cramping.   iron polysaccharides 150 MG capsule Commonly known as:  NIFEREX Take 1 capsule (150 mg total) by mouth daily. Start taking on:  August 31, 2018   loratadine 10 MG tablet Commonly known as:  CLARITIN Take 10 mg by mouth  daily.   magnesium oxide 400 (241.3 Mg) MG tablet Commonly known as:  MAG-OX Take 1 tablet (400 mg total) by mouth daily. Start taking on:  August 31, 2018   oxyCODONE 5 MG immediate release tablet Commonly known as:  Oxy IR/ROXICODONE Take 1 tablet (5 mg total) by mouth every 4 (four) hours as needed for moderate pain.   pantoprazole 40 MG tablet Commonly known as:  PROTONIX Take 40 mg by mouth at bedtime.   PRENATAL VITAMINS PLUS PO Take 1 tablet by mouth daily.   simethicone 80 MG chewable tablet Commonly known as:  MYLICON Chew 1 tablet (80 mg total) by mouth as needed for flatulence.       Diet: routine diet  Activity: Advance as tolerated. Pelvic rest for 6 weeks.   Postpartum contraception: Not Discussed  Newborn Data: Live born female  Birth Weight: 6 lb 15.3 oz (3155 g) APGAR: 8, 9  Newborn Delivery   Birth date/time:  08/27/2018 20:58:00 Delivery type:  C-Section, Low Transverse Trial of labor:  Yes C-section categorization:  Primary     named Max Baby Feeding: Breast Disposition:home with mother   Delivery Report:  Review the Delivery Report for details.    Follow up: Follow-up Information    Noland FordyceFogleman, Kelly, MD. Schedule an appointment as soon as possible for a visit in 6 week(s).   Specialty:  Obstetrics and Gynecology Contact information: [address] [place] [address] [phone]             Signed: Neta MendsDaniela C Kennett Symes, CNM, MSN 08/30/2018, 11:00 AM

## 2018-08-30 NOTE — Lactation Note (Signed)
This note was copied from a baby's chart. Lactation Consultation Note  Patient Name: Debra Nixon [id number]'[id number] Date: 08/30/2018 Reason for consult: Follow-up assessment;Primapara;Term Baby is 62 hours old/8% weight loss.  Mom reports feeding are going well.  She has some bruising from early feeds but feels confident with technique now.  Discussed milk coming to volume and the prevention and treatment of engorgement.  Mom has a breast pump at home.  Lactation outpatient services and support reviewed and encouraged prn.  Maternal Data    Feeding    LATCH Score                   Interventions    Lactation Tools Discussed/Used     Consult Status Consult Status: Complete Follow-up type: Call as needed    Huston Foley 08/30/2018, 11:56 AM

## 2018-08-30 NOTE — Progress Notes (Signed)
Patient ID: Debra Nixon, female   DOB: [birth year], 28 y.o.   MRN: [id number] Subjective: POD# 3 Live born female  Birth Weight: 6 lb 15.3 oz (3155 g) APGAR: 8, 9  Newborn Delivery   Birth date/time:  08/27/2018 20:58:00 Delivery type:  C-Section, Low Transverse Trial of labor:  Yes C-section categorization:  Primary    Baby name: Max Delivering provider: Noland Fordyce   circumcision completed Feeding: breast  Pain control at delivery: Epidural   Reports feeling well, ready for discharge today.   Patient reports tolerating PO.   Breast symptoms: + colostrum, latching on her own. Pain controlled with PO meds Denies HA/SOB/C/P/N/V/dizziness. Flatus present. She reports vaginal bleeding as normal, without clots.  She is ambulating, urinating without difficulty.     Objective:   VS:    Vitals:   08/29/18 0800 08/29/18 1449 08/29/18 2153 08/30/18 0519  BP: 100/68 108/64 (!) 102/51 94/80  Pulse: 66 69 68 83  Resp: 16 16 16 20   Temp: 98.1 F (36.7 C) 97.6 F (36.4 C)  97.9 F (36.6 C)  TempSrc: Oral Oral  Oral  SpO2: 98%     Weight:      Height:        No intake or output data in the 24 hours ending 08/30/18 0857      Recent Labs    08/28/18 0535 08/29/18 1225  WBC 18.3* 12.2*  HGB 10.7* 9.8*  HCT 31.1* 29.6*  PLT 95* 101*     Blood type: --/--/O NEG (02/03 0047)/ Rh neg infant  Rubella: Immune (07/19 0000)  Vaccines: TDaP UTD         Flu    UTD   Physical Exam:  General: alert, cooperative and no distress Abdomen: soft, nontender, normal bowel sounds Incision: clean, dry and intact Uterine Fundus: firm, below umbilicus, nontender Lochia: minimal Ext: edema +1/+2 pedal / pretibial      Assessment/Plan: 28 y.o.   POD# 3. G1P0                  Principal Problem:   Postpartum care following cesarean delivery (2/4) Active Problems:   Thrombocytopenia affecting pregnancy (HCC)  - stable plates > 707   Arrest of dilation, delivered, current  hospitalization Rh negative - newborn Rh neg - prophylaxis not indicated  Doing well, stable.    Routine PP care Dependent edema - advised TED hose daily, nettle and dandelion tea daily for mild diuresis and lactation support              DC home today w/ instructions  F/U at [facility] in 6 weeks and PRN   Neta Mends, CNM, MSN 08/30/2018, 8:57 AM

## 2018-09-01 ENCOUNTER — Inpatient Hospital Stay (HOSPITAL_COMMUNITY): Admit: 2018-09-01 | Payer: Self-pay

## 2018-10-08 ENCOUNTER — Ambulatory Visit: Payer: Self-pay

## 2018-10-08 NOTE — Lactation Note (Signed)
This note was copied from a baby's chart. Lactation Consultation Note  Patient Name: Debra Nixon OMVEH'M Date: 10/08/2018   10/08/2018  Name: Debra Nixon MRN: [id number] Date of Birth: 08/27/2020 Gestational Age: Gestational Age: [redacted]w[redacted]d Birth Weight: 111.3 oz Weight today:    7 pounds 10.4 ounces (3470 grams) with clean newborn diaper  57 week old infant presents today with mom for feeding assessment. Mom concerned with milk supply and infant feeding for long periods of time.   Mom reports she has sore/bruised nipples when she left the hospital. She then obtained NS from the store and began using them. Mom has been pumping since she went home. She is not getting enough to feed infant. Mom reports infant is not able to sustain latch without the NS.   Mom has been getting engorged on the right breast once a week for the last 3 weeks. Milk sometimes not flow out. That is when mom started giving formula.   Infant has gained 581 grams in the last 39 days with an average daily weight gain of 15 grams a day. Infant has gained about 5 ounces in the last week per mom.   Infant self awakens at night for feedings about every 2.5-3 hours and mom awakens him every 3 hours during the day. Infant will feed when awakened with no problem. Infant is more sleepy with feedings if mom awakens him. He is sleepy at the breast and on the bottle.   Mom is feeding infant EBM and formula post BF with an Avent Natural Nipple. Mom reports infant tolerates bottle feeding well.    Mom is pumping after each feeding. Her supply is not enough to feed infant. Mom has a hands free bra that she has not used, enc mom to use with pumping and massage breasts with pumping to help empty them.   Mom's breasts with diffuse redness. Mom reports that it has been there and with her having some plugged duct type symptoms, recommended that mom try Sunflower Lecithin.   Discussed when using the NS it is best to wean  off as infant is able. Enc mom to try each day without it to see when infant is able to latch without it.   Mom took Pure Mom Breastfeeding supplement (Fenugreek, Fennel, and Milk Thistle) x 1 week. She took 2 capsules a day giving her 400 mg Fenugreek. Mom stopped as she did not see a difference in her milk supply. Mom reports she is working on water volume and eating as she is able. Mom has IBS, did not recommend mom taking Fenugreek for that reason. Mom given handout on Legandairy Milk supplement.   Infant with thick labial frenulum that inserts at the bottom of the gum ridge. Upper lip tight with flanging. Infant with sucking blister to upper center lip. Upper lip needs flanging on the breast. Infant with posterior lingual frenulum noted. Infant with hump to back of tongue with suckling. Infant with good tongue extension, lateralization and cupping. Infant disorganized on latching to the bottle. Infant tolerated bottle feeding well. Mom shown restrictions and given website information and local providers.   Infant to follow up with Dr Maisie Fus on March 24. Infant to follow up with Lactation in 2 weeks.       General Information: Mother's reason for visit: Feeding assessment, NS use Consult: Initial Lactation consultant: Noralee Stain RN,IBCLC Breastfeeding experience: BF every 3 hours, supplementing post every feeding   Maternal medications: Pre-natal vitamin, Other(Protonics, allergy meds)  Breastfeeding History: Frequency of breast feeding: every 3 hours, mom wakens him during the day, infant awakens at night Duration of feeding: 10 minutes each breast, mom stops him  Supplementation: Supplement method: bottle Brand: Similac Formula volume: 1.5 ounces  Formula frequency: 4 feeds a day   Breast milk volume: 1.5 ounces Breast milk frequency: 4 feeds in 24 hours   Pump type: Spectra Pump frequency: every 3 hours Pump volume: .5-1.5 ounces  Infant Output Assessment: Voids per 24  hours: 8 ounces Urine color: Clear yellow Stools per 24 hours: 2 Stool color: Yellow  Breast Assessment: Breast: Soft, Compressible, Asymmetrical(right much larger that the left, was prior to pregnancy, diffuse redness to breast) Nipple: Erect Pain level: 3 Pain interventions: Bra, Expressed breast milk, Coconut oil, Nipple shield, Breast pump  Feeding Assessment: Infant oral assessment: Variance Infant oral assessment comment: see note Positioning: Cross cradle(left breast, 10 minutes) Latch: 2 - Grasps breast easily, tongue down, lips flanged, rhythmical sucking. Audible swallowing: 1 - A few with stimulation Type of nipple: 2 - Everted at rest and after stimulation Comfort: 2 - Soft/non-tender Hold: 1 - Assistance needed to correctly position infant at breast and maintain latch LATCH score: 8 Latch assessment: Deep Lips flanged: Yes Suck assessment: Displays both Tools: Nipple shield 24 mm Pre-feed weight: 3470 grams Post feed weight: 3482 grams Amount transferred: 12 ml Amount supplemented: 0  Additional Feeding Assessment: Infant oral assessment: Variance Infant oral assessment comment: see note Positioning: Cross cradle(right breast, 15 minutes) Latch: 2 - Grasps breast easily, tongue down, lips flanged, rhythmical sucking. Audible swallowing: 2 - Spontaneous and intermittent Type of nipple: 2 - Everted at rest and after stimulation Comfort: 2 - Soft/non-tender Hold: 2 - No assistance needed to correctly position infant at breast LATCH score: 10 Latch assessment: Deep Lips flanged: No(upper lip needs flanging) Suck assessment: Displays both Tools: Nipple shield 24 mm Pre-feed weight: 3482 grams Post feed weight: 3502 grams Amount transferred: 20 ml Amount supplemented: 30 ml EBM and 30 ml formula via bottle  Totals: Total amount transferred: 32 ml Total supplement given: 60 ml Total amount pumped post feed: 15 ml   Plan:  1. Feed infant at the breast with  feeding cues, make sure infant gets at least 7-8 feedings in 24 hours Limit breast feeding to about 20 minutes when infant is sleepy at the breast 2. Use the # 24 Nipple shield with feeding as needed for latch and pain. Try each day to feed without it to see when infant is ready to feed without it 3. Keep infant awake at the breast with feeding as needed, Feed infant skin to skin for now 4. Massage/compress breast with feeding as needed when infant slows down.  5. Continue to offer infant a bottle of pumped breast milk or formula after breast feeding, especially if he is still cueing to feed 6. Feed infant using the paced bottle method (video on [email or web address])  7. Infant needs about 64-85 ml (2-3 ounces) for 8 feedings a day or 510-680 ml (17-23 ounces) in 24 hours. Infant may take more or less depending on how often infant feeds. Feed infant until he is satisfied.  8. Continue pumping after each feeding with your Double electric breast pump for 15-20 minutes. Massage breasts before pumping and in the middle of your pumping time. Try your hands free bra and massage with pumping 9. Would recommend you trying Sunflower Lecithin 1200 mg Capsules taking one 4 x a day (breakfast, lunch, dinner,  bedtime) 10. Talk with your OB about increasing Fenugreek dosage or Reglan 11. Keep up the good work 12. Thank you for allowing me to assist you today 13. Please call with any questions/concerns as needed [phone] 14. Follow up with Lactation in 2 weeks or 1-5 days post tongue and lip releases if completed.     Engineering geologist, IBCLC                                                        Debra Nixon Debra Nixon 10/08/2018, 10:07 AM

## 2018-10-09 DIAGNOSIS — O906 Postpartum mood disturbance: Secondary | ICD-10-CM | POA: Diagnosis not present

## 2018-10-09 DIAGNOSIS — K219 Gastro-esophageal reflux disease without esophagitis: Secondary | ICD-10-CM | POA: Diagnosis not present

## 2018-10-23 ENCOUNTER — Ambulatory Visit: Payer: Self-pay

## 2018-10-23 NOTE — Lactation Note (Signed)
This note was copied from a baby's chart. Lactation Consultation Note  Patient Name: Debra Nixon YNWGN'F Date: 10/23/2018   10/23/2018  Name: Debra Nixon MRN: [id number] Date of Birth: 08/27/2020 Gestational Age: Gestational Age: [redacted]w[redacted]d Birth Weight: 111.3 oz Weight today:    7 pounds 15.2 ounces (3606 grams) with clean newborn diaper  29 week old infant presents today with mom for follow up feeding assessment. Infant post tongue and lip releases on 3/24 by Dr. Orland Mustard. Parents are performing stretches per Dr. Orland Mustard.   Infant has gained 136 grams in the last 15 days with an average daily weight gain of 9 grams a day. Enc mom to try to increase supplement after each feeding.  Mom saw Dr. Maisie Fus last week and infant was 7 pounds 10 ounces indicating infant has gained.   Infant is spitting some when having difficulty with burping. Parents are keeping upright for 20 minutes after feeding. Mom was supplementing with 2 ounces and cut back since infant was spitting up more.Infant is breast feeding with each feeding a about every 3 hours for 20 minutes using a # 24 nipple shield. Infant is then supplemented with 1.5 ounces of pumped breast milk or formula.   Infant with granulation tissue to upper lip. Upper lip tight with flanging although more flexible. Infant with diamond shaped granulation presents. Tongue stretches well. Mom reports less gas in the infant. Infant tolerated bottle feeding well. Infant disorganized on the bottle and the gloved finger.   Mom with asymmetrical breast size (right breast H cup, Left breast D cup. The left breast usually produces about very little and barely covers the bottom of the bottle, she can get close to an ounce in the middle of the night. Mom usually gets about 1.5 ounces after infant feeds on the right breast and 2-2.25 ounces when not BF infant on that breast. Mom has been taking Legendairy Milk Liquid Gold and now [employer] and has  noticed some increase in supply.   Mom has not tried to latch without the NS, enc her to start trying to feed without it. Discussed it may take 2-4 weeks to see the benefit and change in the infant suckling post releases and to be patient and keep trying to feed without the NS.   Dad is feeding infant a bottle in the middle of the night.   Mom is taking Sunflower Lecithin Capsules a day and has not had further plugged ducts.   Infant to follow up with Dr. Orland Mustard and Dr. Maisie Fus next week. Infant to follow up with Lactation in 2 weeks.     General Information: Mother's reason for visit: Follow up feeding assessment, NS use, Post tongue and lip releases on 3/24 by Dr. Orland Mustard Consult: Follow-up Lactation consultant: Jasmine December Sophonie Goforth RN,IBCLC Breastfeeding experience: BF every 2.5-4 hours for 20 minutes   Maternal medications: Pre-natal vitamin, Other(Protonics, Allergy Meds, Sunflower Lecithin 1200 mg QID,  Legendairy Liquid Gold and [employer])  Breastfeeding History: Frequency of breast feeding: every 2.5-4 hours, infant is awakening more for feedings Duration of feeding: 20 minutes, mom stops him and supplements with bottle  Supplementation: Supplement method: bottle(Avent Natural Flow) Brand: Similac Formula volume: 1.5-2 ounces Formula frequency: 2 x a day Total formula volume per day: 4 ounces in the last 24 hours Breast milk volume: 1.5 ounces (2.5 ounces for 1 bottle feeding at night) Breast milk frequency: 6 feeds a day   Pump type: Spectra Pump frequency: every 3 hours Pump  volume: 1.5-3 ounces  Infant Output Assessment: Voids per 24 hours: 8 Urine color: Clear yellow Stools per 24 hours: 1 Stool color: Yellow  Breast Assessment: Breast: Soft, Compressible, Asymmetrical Nipple: Erect, Reddened Pain level: 0(without pillow support at home she is experiencing pain) Pain interventions: Bra, Breast pump, Nipple shield, Coconut oil, Expressed breast milk  Feeding  Assessment: Infant oral assessment: Variance Infant oral assessment comment: see note Positioning: Cross cradle(left breast, 8 minutes) Latch: 2 - Grasps breast easily, tongue down, lips flanged, rhythmical sucking. Audible swallowing: 1 - A few with stimulation Type of nipple: 2 - Everted at rest and after stimulation Comfort: 2 - Soft/non-tender Hold: 2 - No assistance needed to correctly position infant at breast LATCH score: 9 Latch assessment: Deep Lips flanged: Yes Suck assessment: Displays both Tools: Nipple shield 24 mm Pre-feed weight: 3606 grams Post feed weight: 3612 grams Amount transferred: 6 ml Amount supplemented: 0  Additional Feeding Assessment: Infant oral assessment: Variance Infant oral assessment comment: see note Positioning: Cross cradle(right breast, 25 minutes) Latch: 2 - Grasps breast easily, tongue down, lips flanged, rhythmical sucking. Audible swallowing: 2 - Spontaneous and intermittent Type of nipple: 2 - Everted at rest and after stimulation Comfort: 2 - Soft/non-tender Hold: 2 - No assistance needed to correctly position infant at breast LATCH score: 10 Latch assessment: Deep Lips flanged: Yes Suck assessment: Displays both Tools: Nipple shield 24 mm Pre-feed weight: 3612 grams Post feed weight: 3624 grams Amount transferred: 12 ml Amount supplemented: 45 ml EBM via bottle  Totals: Total amount transferred: 18 ml Total supplement given: 45 ml EBM via bottle Total amount pumped post feed: did not pump   Plan:  1. Feed infant at the breast with feeding cues, make sure infant gets at least 7-8 feedings in 24 hours Limit breast feeding to about 20 minutes when infant is sleepy at the breast 2. Use the # 24 Nipple shield with feeding as needed for latch and pain. Try each day to feed without it to see when infant is ready to feed without it 3. Keep infant awake at the breast with feeding as needed, Feed infant skin to skin for now 4.  Massage/compress breast with feeding as needed when infant slows down.  5. Continue to offer infant a bottle of pumped breast milk or formula after breast feeding, especially if he is still cueing to feed. Increase volume as infant will tolerate.  6. Feed infant using the paced bottle method (video on [email or web address])  7. Infant needs about 68-90 ml (2.5-3 ounces) for 8 feedings a day or 540-720 ml (18-24 ounces) in 24 hours. Infant may take more or less depending on how often infant feeds. Feed infant until he is satisfied.  8. Continue pumping after each feeding with your Double electric breast pump for 15-20 minutes. Massage breasts before pumping and in the middle of your pumping time. Try your hands free bra and massage with pumping 9. Would recommend you trying Sunflower Lecithin 1200 mg Capsules taking one 4 x a day (breakfast, lunch, dinner, bedtime) 10. Continue to do stretches per Dr. Orland Mustard 11. Begin Suck Training Exercises 5-6 x a day for 1-2 minutes per exercise for 2-3 week until suck has improved 12. Keep up the good work 13. Thank you for allowing me to assist you today 14. Please call with any questions/concerns as needed [phone] 15. Follow up with Lactation in 2 weeks (11/06/2018 @ 08:30)   Ed Blalock RN, IBCLC  Silas Flood Javelle Donigan 10/23/2018, 8:25 AM

## 2018-11-24 ENCOUNTER — Encounter (HOSPITAL_COMMUNITY): Payer: Self-pay

## 2018-11-26 DIAGNOSIS — Z3046 Encounter for surveillance of implantable subdermal contraceptive: Secondary | ICD-10-CM | POA: Diagnosis not present

## 2018-11-26 DIAGNOSIS — Z3202 Encounter for pregnancy test, result negative: Secondary | ICD-10-CM | POA: Diagnosis not present

## 2018-12-24 DIAGNOSIS — F419 Anxiety disorder, unspecified: Secondary | ICD-10-CM | POA: Diagnosis not present

## 2019-01-06 DIAGNOSIS — O9122 Nonpurulent mastitis associated with the puerperium: Secondary | ICD-10-CM | POA: Diagnosis not present

## 2019-01-09 ENCOUNTER — Other Ambulatory Visit: Payer: Self-pay | Admitting: Obstetrics

## 2019-01-09 DIAGNOSIS — N61 Mastitis without abscess: Secondary | ICD-10-CM

## 2019-01-09 DIAGNOSIS — N6310 Unspecified lump in the right breast, unspecified quadrant: Secondary | ICD-10-CM

## 2019-01-10 ENCOUNTER — Other Ambulatory Visit: Payer: Self-pay

## 2019-01-10 ENCOUNTER — Ambulatory Visit
Admission: RE | Admit: 2019-01-10 | Discharge: 2019-01-10 | Disposition: A | Discharge status: Home / Self Care | Payer: BC Managed Care – PPO | Source: Ambulatory Visit | Attending: Obstetrics | Admitting: Obstetrics

## 2019-01-10 DIAGNOSIS — N6310 Unspecified lump in the right breast, unspecified quadrant: Secondary | ICD-10-CM

## 2019-01-10 DIAGNOSIS — N6459 Other signs and symptoms in breast: Secondary | ICD-10-CM | POA: Diagnosis not present

## 2019-01-10 DIAGNOSIS — N61 Mastitis without abscess: Secondary | ICD-10-CM

## 2019-01-10 DIAGNOSIS — R922 Inconclusive mammogram: Secondary | ICD-10-CM | POA: Diagnosis not present

## 2019-05-05 ENCOUNTER — Other Ambulatory Visit: Payer: Self-pay

## 2019-05-05 DIAGNOSIS — Z20822 Contact with and (suspected) exposure to covid-19: Secondary | ICD-10-CM

## 2019-05-05 DIAGNOSIS — Z20828 Contact with and (suspected) exposure to other viral communicable diseases: Secondary | ICD-10-CM | POA: Diagnosis not present

## 2019-05-06 LAB — NOVEL CORONAVIRUS, NAA: SARS-CoV-2, NAA: NOT DETECTED

## 2019-06-25 DIAGNOSIS — R300 Dysuria: Secondary | ICD-10-CM | POA: Diagnosis not present

## 2019-06-25 DIAGNOSIS — R109 Unspecified abdominal pain: Secondary | ICD-10-CM | POA: Diagnosis not present

## 2019-08-11 DIAGNOSIS — N926 Irregular menstruation, unspecified: Secondary | ICD-10-CM | POA: Diagnosis not present

## 2019-08-11 DIAGNOSIS — F419 Anxiety disorder, unspecified: Secondary | ICD-10-CM | POA: Diagnosis not present

## 2019-08-11 DIAGNOSIS — Z1151 Encounter for screening for human papillomavirus (HPV): Secondary | ICD-10-CM | POA: Diagnosis not present

## 2019-08-11 DIAGNOSIS — Z01419 Encounter for gynecological examination (general) (routine) without abnormal findings: Secondary | ICD-10-CM | POA: Diagnosis not present

## 2019-08-11 DIAGNOSIS — Z3200 Encounter for pregnancy test, result unknown: Secondary | ICD-10-CM | POA: Diagnosis not present

## 2019-08-22 DIAGNOSIS — N921 Excessive and frequent menstruation with irregular cycle: Secondary | ICD-10-CM | POA: Diagnosis not present

## 2019-09-04 IMAGING — US US BREAST*R* LIMITED INC AXILLA
1 series · 3 of 3 positions shown · non-contrast
Comparison: None

CLINICAL DATA: Patient with history of mastitis involving the outer
aspect of the right breast. Patient states there has been
improvement in the symptoms over the last 5 days after starting
antibiotic therapy. Patient has approximately 5 days of antibiotics
remaining. For evaluation of palpable abnormality in the outer right
breast to exclude the possibility of abscess.

EXAM:
DIGITAL DIAGNOSTIC BILATERAL MAMMOGRAM WITH CAD AND TOMO
ULTRASOUND RIGHT BREAST

[Series 1: us breast*right* limited inc axilla · 0.07mm/px · 3 of 3 slices shown]
[im 1/3]
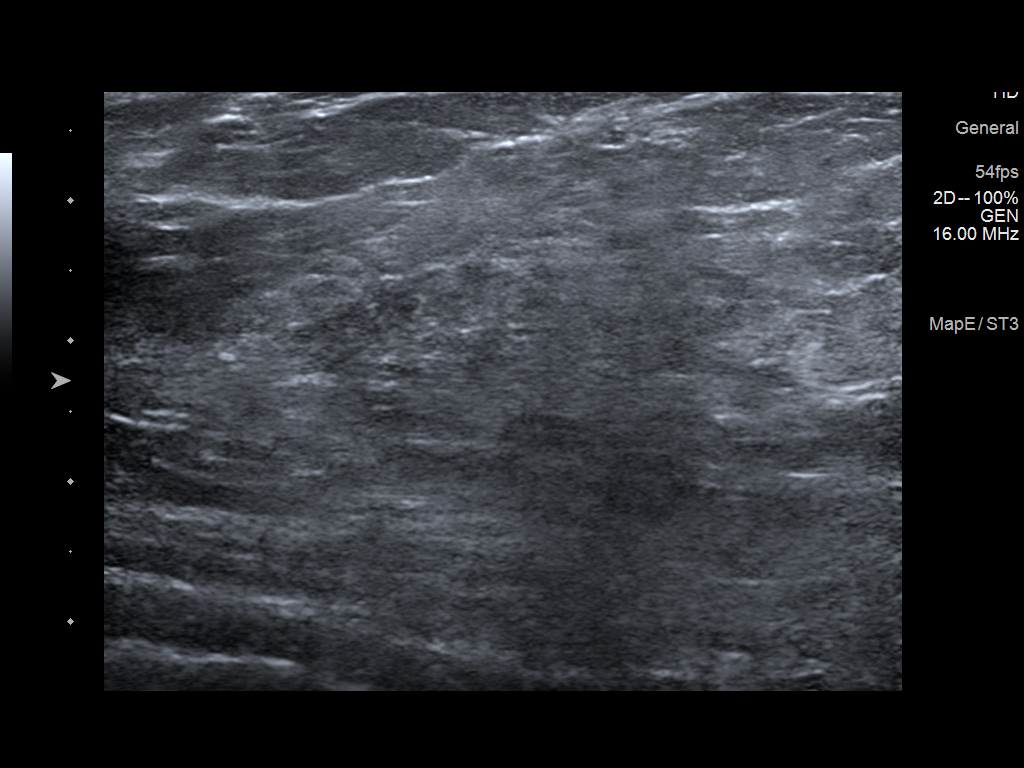
[im 2/3]
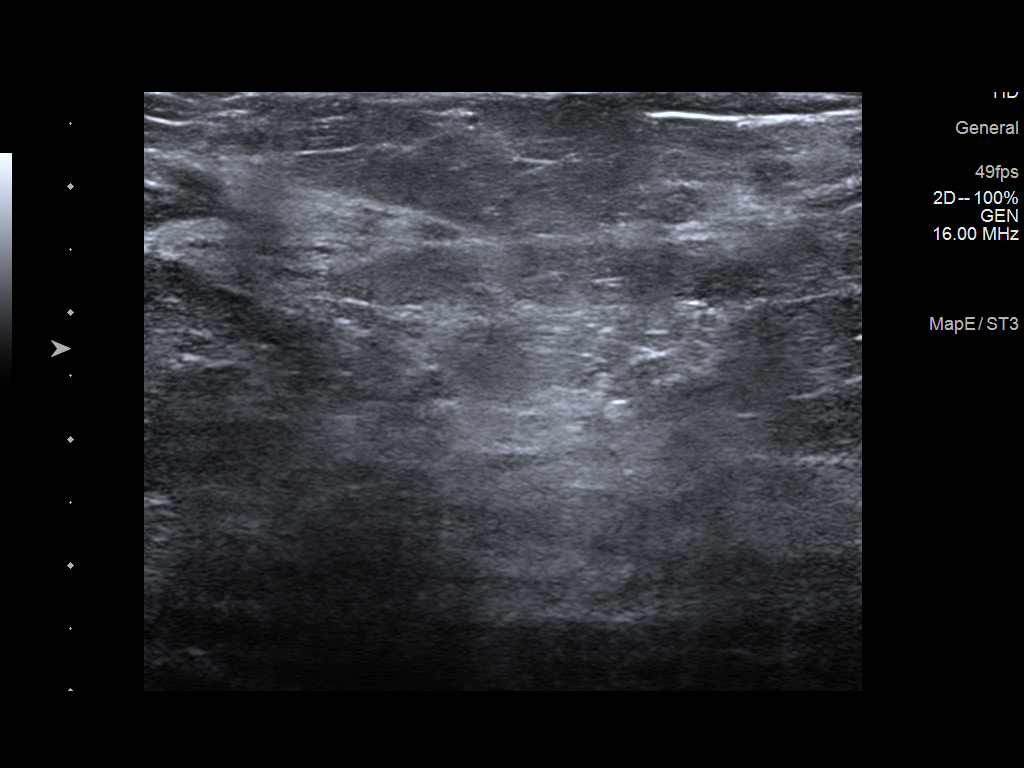
[im 3/3]
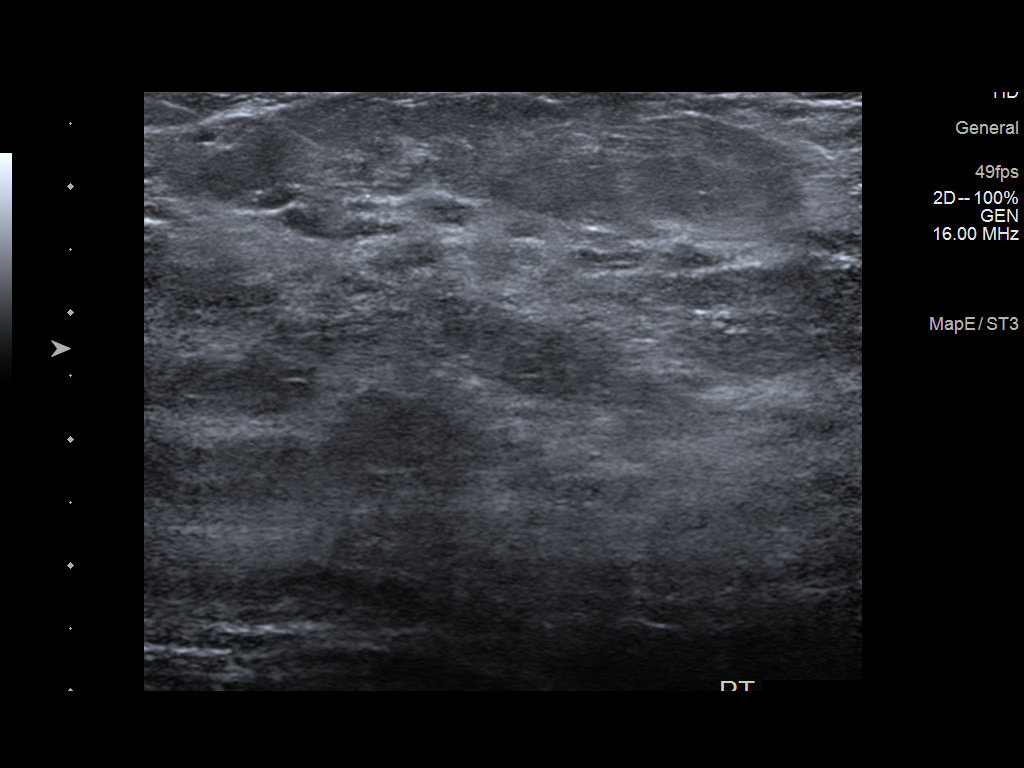

[3 of 3 positions shown; findings below may reference images not displayed]

ACR Breast Density Category c: The breast tissue is heterogeneously
dense, which may obscure small masses.
FINDINGS: No concerning masses, calcifications or distortion identified within
either breast.

Mammographic images were processed with CAD.

On physical exam, dense tissue is palpated within the outer right
breast. There is a small faint area of cutaneous redness involving
the outer right breast.

Targeted ultrasound is performed, showing normal dense tissue
without suspicious mass or underlying abscess within the right
breast 9 o'clock position.
IMPRESSION: Findings most consistent with improving right mastitis.

No evidence for underlying abscess.

No mammographic evidence for malignancy.

RECOMMENDATION:
Continued clinical evaluation to ensure resolution of right breast
mastitis.

Screening mammogram at age 38 unless there are persistent or
intervening clinical concerns. (Code:P0-B-11P)

I have discussed the findings and recommendations with the patient.
Results were also provided in writing at the conclusion of the
visit. If applicable, a reminder letter will be sent to the patient
regarding the next appointment.

BI-RADS CATEGORY  2: Benign.

## 2019-10-20 DIAGNOSIS — Z20828 Contact with and (suspected) exposure to other viral communicable diseases: Secondary | ICD-10-CM | POA: Diagnosis not present

## 2019-11-11 DIAGNOSIS — N921 Excessive and frequent menstruation with irregular cycle: Secondary | ICD-10-CM | POA: Diagnosis not present

## 2019-11-11 DIAGNOSIS — Z3046 Encounter for surveillance of implantable subdermal contraceptive: Secondary | ICD-10-CM | POA: Diagnosis not present

## 2020-04-27 DIAGNOSIS — F411 Generalized anxiety disorder: Secondary | ICD-10-CM | POA: Diagnosis not present

## 2020-05-19 DIAGNOSIS — F411 Generalized anxiety disorder: Secondary | ICD-10-CM | POA: Diagnosis not present

## 2020-05-31 DIAGNOSIS — F411 Generalized anxiety disorder: Secondary | ICD-10-CM | POA: Diagnosis not present

## 2020-06-24 DIAGNOSIS — F411 Generalized anxiety disorder: Secondary | ICD-10-CM | POA: Diagnosis not present

## 2020-07-05 DIAGNOSIS — F411 Generalized anxiety disorder: Secondary | ICD-10-CM | POA: Diagnosis not present

## 2020-07-23 DIAGNOSIS — Z03818 Encounter for observation for suspected exposure to other biological agents ruled out: Secondary | ICD-10-CM | POA: Diagnosis not present

## 2020-08-11 DIAGNOSIS — F411 Generalized anxiety disorder: Secondary | ICD-10-CM | POA: Diagnosis not present

## 2020-09-03 DIAGNOSIS — F411 Generalized anxiety disorder: Secondary | ICD-10-CM | POA: Diagnosis not present

## 2020-09-23 DIAGNOSIS — F411 Generalized anxiety disorder: Secondary | ICD-10-CM | POA: Diagnosis not present

## 2020-10-27 DIAGNOSIS — F411 Generalized anxiety disorder: Secondary | ICD-10-CM | POA: Diagnosis not present

## 2020-11-23 DIAGNOSIS — Z6829 Body mass index (BMI) 29.0-29.9, adult: Secondary | ICD-10-CM | POA: Diagnosis not present

## 2020-11-23 DIAGNOSIS — Z01419 Encounter for gynecological examination (general) (routine) without abnormal findings: Secondary | ICD-10-CM | POA: Diagnosis not present

## 2020-11-26 DIAGNOSIS — F411 Generalized anxiety disorder: Secondary | ICD-10-CM | POA: Diagnosis not present

## 2020-12-31 DIAGNOSIS — Z8349 Family history of other endocrine, nutritional and metabolic diseases: Secondary | ICD-10-CM | POA: Diagnosis not present

## 2020-12-31 DIAGNOSIS — K219 Gastro-esophageal reflux disease without esophagitis: Secondary | ICD-10-CM | POA: Diagnosis not present

## 2020-12-31 DIAGNOSIS — Z13220 Encounter for screening for lipoid disorders: Secondary | ICD-10-CM | POA: Diagnosis not present

## 2020-12-31 DIAGNOSIS — Z0000 Encounter for general adult medical examination without abnormal findings: Secondary | ICD-10-CM | POA: Diagnosis not present

## 2020-12-31 DIAGNOSIS — D696 Thrombocytopenia, unspecified: Secondary | ICD-10-CM | POA: Diagnosis not present

## 2020-12-31 DIAGNOSIS — Z23 Encounter for immunization: Secondary | ICD-10-CM | POA: Diagnosis not present

## 2021-01-04 DIAGNOSIS — F411 Generalized anxiety disorder: Secondary | ICD-10-CM | POA: Diagnosis not present

## 2021-02-11 DIAGNOSIS — F411 Generalized anxiety disorder: Secondary | ICD-10-CM | POA: Diagnosis not present

## 2021-03-10 DIAGNOSIS — F411 Generalized anxiety disorder: Secondary | ICD-10-CM | POA: Diagnosis not present

## 2021-03-31 DIAGNOSIS — F411 Generalized anxiety disorder: Secondary | ICD-10-CM | POA: Diagnosis not present

## 2021-04-19 DIAGNOSIS — F411 Generalized anxiety disorder: Secondary | ICD-10-CM | POA: Diagnosis not present

## 2022-01-16 DIAGNOSIS — Z01419 Encounter for gynecological examination (general) (routine) without abnormal findings: Secondary | ICD-10-CM | POA: Diagnosis not present

## 2022-01-16 DIAGNOSIS — Z6828 Body mass index (BMI) 28.0-28.9, adult: Secondary | ICD-10-CM | POA: Diagnosis not present

## 2022-01-26 DIAGNOSIS — F419 Anxiety disorder, unspecified: Secondary | ICD-10-CM | POA: Diagnosis not present

## 2022-01-26 DIAGNOSIS — Z8349 Family history of other endocrine, nutritional and metabolic diseases: Secondary | ICD-10-CM | POA: Diagnosis not present

## 2022-01-26 DIAGNOSIS — Z0000 Encounter for general adult medical examination without abnormal findings: Secondary | ICD-10-CM | POA: Diagnosis not present

## 2022-01-26 DIAGNOSIS — F329 Major depressive disorder, single episode, unspecified: Secondary | ICD-10-CM | POA: Diagnosis not present

## 2022-01-26 DIAGNOSIS — Z13220 Encounter for screening for lipoid disorders: Secondary | ICD-10-CM | POA: Diagnosis not present

## 2022-04-27 DIAGNOSIS — F411 Generalized anxiety disorder: Secondary | ICD-10-CM | POA: Diagnosis not present

## 2022-05-18 DIAGNOSIS — F411 Generalized anxiety disorder: Secondary | ICD-10-CM | POA: Diagnosis not present

## 2022-06-08 DIAGNOSIS — F411 Generalized anxiety disorder: Secondary | ICD-10-CM | POA: Diagnosis not present

## 2022-07-06 DIAGNOSIS — F411 Generalized anxiety disorder: Secondary | ICD-10-CM | POA: Diagnosis not present

## 2024-01-21 ENCOUNTER — Encounter (INDEPENDENT_AMBULATORY_CARE_PROVIDER_SITE_OTHER): Payer: Self-pay

## 2024-01-21 ENCOUNTER — Ambulatory Visit (INDEPENDENT_AMBULATORY_CARE_PROVIDER_SITE_OTHER): Admitting: Plastic Surgery

## 2024-01-21 VITALS — BP 117/75 | HR 64 | Ht 62.0 in | Wt 165.8 lb

## 2024-01-21 DIAGNOSIS — Z6830 Body mass index (BMI) 30.0-30.9, adult: Secondary | ICD-10-CM | POA: Diagnosis not present

## 2024-01-21 DIAGNOSIS — M549 Dorsalgia, unspecified: Secondary | ICD-10-CM | POA: Insufficient documentation

## 2024-01-21 DIAGNOSIS — M546 Pain in thoracic spine: Secondary | ICD-10-CM | POA: Diagnosis not present

## 2024-01-21 DIAGNOSIS — N62 Hypertrophy of breast: Secondary | ICD-10-CM | POA: Insufficient documentation

## 2024-01-21 DIAGNOSIS — M542 Cervicalgia: Secondary | ICD-10-CM | POA: Diagnosis not present

## 2024-01-21 DIAGNOSIS — G8929 Other chronic pain: Secondary | ICD-10-CM

## 2024-01-21 DIAGNOSIS — K219 Gastro-esophageal reflux disease without esophagitis: Secondary | ICD-10-CM

## 2024-01-21 NOTE — Progress Notes (Addendum)
 Patient ID: Debra Nixon, female    DOB: 1991-03-01, 33 y.o.   MRN: [id number]   Chief Complaint  Patient presents with   Consult    Mammary Hyperplasia: The patient is a 33 y.o. female with a history of mammary hyperplasia for several years.  She has extremely large breasts causing symptoms that include the following: Back pain in the upper and lower back, including neck pain. She pulls or pins her bra straps to provide better lift and relief of the pressure and pain. She notices relief by holding her breast up manually.  Her shoulder straps cause grooves and pain and pressure that requires padding for relief. Pain medication is sometimes required with motrin  and tylenol .  Activities that are hindered by enlarged breasts include: exercise and running.  She has tried supportive clothing as well as fitted bras without improvement.  Her breasts are extremely large and very asymmetric. The right breast is larger than the left.  She has hyperpigmentation of the inframammary area on both sides.  The sternal to nipple distance on the right is 29 cm and the left is 27 cm.  The IMF distance is 11 cm.  She is 5 feet 2 inches tall and weighs 165 pounds.  The BMI = 30.2 kg/m.  Preoperative bra size = 36 DD cup.  The estimated excess breast tissue to be removed at the time of surgery = 398 grams on the left and 450 grams on the right.  Mammogram history: yes and was negative.  Family history of breast cancer:  paternal grandmother.  Tobacco use:  none.   The patient expresses the desire to pursue surgical intervention.     Review of Systems  Constitutional:  Positive for activity change. Negative for appetite change.  Eyes: Negative.   Respiratory: Negative.    Cardiovascular: Negative.   Gastrointestinal: Negative.   Endocrine: Negative.   Genitourinary: Negative.   Musculoskeletal: Negative.   Neurological: Negative.   Hematological: Negative.     Past Medical History:  Diagnosis Date    Anxiety    GERD (gastroesophageal reflux disease)    IBS (irritable bowel syndrome)    Thrombocytopenia (HCC)     Past Surgical History:  Procedure Laterality Date   CESAREAN SECTION N/A 08/27/2018   Procedure: CESAREAN SECTION;  Surgeon: Kandyce Sor, MD;  Location: [facility] BIRTHING SUIT[facility];  Service: Obstetrics;  Laterality: N/A;   JOINT REPLACEMENT        Current Outpatient Medications:    levocetirizine (XYZAL) 2.5 MG/5ML solution, Take 2.5 mg by mouth every evening., Disp: , Rfl:    sertraline (ZOLOFT) 100 MG tablet, Take 100 mg by mouth daily., Disp: , Rfl:    acetaminophen  (TYLENOL ) 325 MG tablet, Take 650 mg by mouth every 6 (six) hours as needed for mild pain or headache., Disp: , Rfl:    coconut oil OIL, Apply 1 application topically as needed., Disp: , Rfl: 0   ibuprofen  (ADVIL ,MOTRIN ) 600 MG tablet, Take 1 tablet (600 mg total) by mouth every 6 (six) hours as needed for cramping., Disp: 30 tablet, Rfl: 0   iron  polysaccharides (NIFEREX) 150 MG capsule, Take 1 capsule (150 mg total) by mouth daily., Disp: , Rfl:    loratadine  (CLARITIN ) 10 MG tablet, Take 10 mg by mouth daily., Disp: , Rfl:    magnesium  oxide (MAG-OX) 400 (241.3 Mg) MG tablet, Take 1 tablet (400 mg total) by mouth daily., Disp: , Rfl:    Multiple Vitamins-Minerals (AIRBORNE) TBEF,  Take 1 tablet by mouth 2 (two) times a week. For cold prevention, Disp: , Rfl:    oxyCODONE  (OXY IR/ROXICODONE ) 5 MG immediate release tablet, Take 1 tablet (5 mg total) by mouth every 4 (four) hours as needed for moderate pain., Disp: 30 tablet, Rfl: 0   pantoprazole  (PROTONIX ) 40 MG tablet, Take 40 mg by mouth at bedtime. , Disp: , Rfl:    Prenatal Vit-Fe Fumarate-FA (PRENATAL VITAMINS PLUS PO), Take 1 tablet by mouth daily. , Disp: , Rfl:    simethicone  (MYLICON) 80 MG chewable tablet, Chew 1 tablet (80 mg total) by mouth as needed for flatulence., Disp: 30 tablet, Rfl: 0   Objective:   Vitals:   01/21/24 1125  BP: 117/75   Pulse: 64  SpO2: 96%    Physical Exam Vitals reviewed.  Constitutional:      Appearance: Normal appearance.  HENT:     Head: Normocephalic and atraumatic.   Cardiovascular:     Rate and Rhythm: Normal rate.     Pulses: Normal pulses.  Pulmonary:     Effort: Pulmonary effort is normal.  Abdominal:     Palpations: Abdomen is soft.   Skin:    General: Skin is warm.     Capillary Refill: Capillary refill takes less than 2 seconds.   Neurological:     Mental Status: She is alert and oriented to person, place, and time.   Psychiatric:        Mood and Affect: Mood normal.        Behavior: Behavior normal.        Thought Content: Thought content normal.        Judgment: Judgment normal.     Assessment & Plan:  Macromastia - Plan: Amb Ref to Medical Weight Management  Chronic GERD  Chronic bilateral thoracic back pain  Neck pain  Symptomatic mammary hypertrophy  The procedure the patient selected and that was best for the patient was discussed. The risk were discussed and include but not limited to the following:  Breast asymmetry, fluid accumulation, firmness of the breast, inability to breast feed, loss of nipple or areola, skin loss, change in skin and nipple sensation, fat necrosis of the breast tissue, bleeding, infection and healing delay.  There are risks of anesthesia and injury to nerves or blood vessels.  Allergic reaction to tape, suture and skin glue are possible.  There will be swelling.  Any of these can lead to the need for revisional surgery which is not included in this surgery.  A breast reduction has potential to interfere with diagnostic procedures in the future.  This procedure is best done when the breast is fully developed.  Changes in the breast will continue to occur over time: pregnancy, weight gain or weigh loss. No guarantees are given for a certain bra or breast size.    Total time: 40 minutes. This includes time spent with the patient during the  visit as well as time spent before and after the visit reviewing the chart, documenting the encounter, ordering pertinent studies and literature for the patient.    Mammogram: Not required Healthy Weight and Wellness: Referral placed  Patient is a good candidate for bilateral breast reduction.  She will require more off on the right and compared to the left.  She will come and see us  when she has finished with her consult with healthy weight wellness since she is trying to lose a little bit more weight.  Pictures were obtained of  the patient and placed in the chart with the patient's or guardian's permission.   Estefana RAMAN Anner Baity, DO

## 2024-02-12 ENCOUNTER — Encounter (INDEPENDENT_AMBULATORY_CARE_PROVIDER_SITE_OTHER): Payer: Self-pay

## 2024-03-14 ENCOUNTER — Telehealth: Payer: Self-pay | Admitting: Surgical

## 2024-03-14 NOTE — Telephone Encounter (Signed)
 Lvmail to r/s provider out of office

## 2024-04-15 ENCOUNTER — Institutional Professional Consult (permissible substitution): Admitting: Plastic Surgery

## 2024-04-22 ENCOUNTER — Ambulatory Visit: Admitting: Surgical

## 2024-04-23 ENCOUNTER — Ambulatory Visit: Admitting: Student

## 2024-04-23 VITALS — BP 103/59 | HR 83 | Ht 62.0 in | Wt 157.0 lb

## 2024-04-23 DIAGNOSIS — Z803 Family history of malignant neoplasm of breast: Secondary | ICD-10-CM | POA: Diagnosis not present

## 2024-04-23 DIAGNOSIS — N62 Hypertrophy of breast: Secondary | ICD-10-CM | POA: Diagnosis not present

## 2024-04-23 DIAGNOSIS — R21 Rash and other nonspecific skin eruption: Secondary | ICD-10-CM

## 2024-04-23 NOTE — Progress Notes (Signed)
   Referring Provider Burney Darice CROME, MD [address] [place],  [place] [address]   CC: No chief complaint on file.     Debra Nixon is an 33 y.o. female.  HPI: Patient is a 33 year old female with history of macromastia.  She presents to the clinic today for follow-up on her weight for consideration of breast reduction surgery.  Patient was seen for initial consult by Dr. Lowery on 01/21/2024.  At this visit, patient reported upper back and neck pain due to her enlarged breasts.  On exam, her STN on the right was 29 cm and her STN on the left was 27 cm.  Her weight was 165 pounds and her BMI was 30.2 kg/m.  Her bra size was a DD cup.  The estimated amount of breast tissue to be removed at the time of surgery was 300 g on the left and 450 g on the right.  Patient was found to be a good candidate for bilateral breast reduction.  It was noted that patient would require more weight off of the right compared to the left.  A referral was placed to healthy weight and wellness and the plan was for the patient to return to our clinic when she has finished her consult with them since she was trying to lose a little bit more weight.  Today, patient reports she is doing well.  She states that she has been losing weight herself through diet and exercise.  She states that she is currently happy with her weight and would like to proceed with surgery.  She states that the asymmetry of her breasts bother her and she states that she experiences rashes underneath her breasts as well.  Patient states that she is not a smoker.  She denies any history of cardiac disease.  She denies taking any blood thinners.  She denies any history of diabetes.  Patient does state that she did have a grandmother with history of breast cancer.  She also states that she had a mammogram about 5 years ago for mastitis.  Review of Systems General: Does not report any changes in health  Physical Exam    01/21/2024    11:25 AM 08/30/2018    5:19 AM 08/29/2018    9:53 PM  Vitals with BMI  Height 5' 2    Weight 165 lbs 13 oz    BMI 30.32    Systolic 117 94 102  Diastolic 75 80 51  Pulse 64 83 68    General:  No acute distress,  Alert and oriented, Non-Toxic, Normal speech and affect Psych: Normal behavior and mood Respiratory: No increased WOB MSK: Ambulatory  Assessment/Plan  Macromastia   I discussed with the patient that no that she is at a place where she feels like her weight is stable and she is happy with her weight, we can go ahead and submit to insurance.  We discussed that this could take up to 6 weeks.  She expressed understanding.  Given patient's history, discussed with her there is the possibility of her needing a mammogram prior to surgery.  I discussed with her that I will talk with Dr. Lowery about this.  She expressed understanding.  All of the patient's questions were answered to her satisfaction.  I instructed her to call if she has any questions or concerns about anything.  Debra Nixon 04/23/2024, 10:03 AM

## 2024-04-24 ENCOUNTER — Other Ambulatory Visit: Payer: Self-pay | Admitting: Student

## 2024-04-24 DIAGNOSIS — N62 Hypertrophy of breast: Secondary | ICD-10-CM

## 2024-04-24 NOTE — Progress Notes (Signed)
 Discussed with Dr. Lowery about possibility of mammogram for this patient, and she recommended we go ahead and order 1 for her.  I discussed this with the patient and she expressed understanding was in agreement with moving forward with a mammogram.

## 2024-05-20 ENCOUNTER — Ambulatory Visit
Admission: RE | Admit: 2024-05-20 | Discharge: 2024-05-20 | Disposition: A | Discharge status: Home / Self Care | Source: Ambulatory Visit | Attending: Student | Admitting: Student

## 2024-05-20 DIAGNOSIS — N62 Hypertrophy of breast: Secondary | ICD-10-CM

## 2024-05-28 ENCOUNTER — Ambulatory Visit: Payer: Self-pay | Admitting: Student

## 2024-05-28 NOTE — Telephone Encounter (Signed)
 Discussed the results of the mammogram with the patient.  She expressed understanding.

## 2024-08-21 ENCOUNTER — Ambulatory Visit: Admitting: Student

## 2024-08-21 VITALS — BP 91/67 | HR 76 | Wt 158.0 lb

## 2024-08-21 DIAGNOSIS — N62 Hypertrophy of breast: Secondary | ICD-10-CM

## 2024-08-21 MED ORDER — CEPHALEXIN 500 MG PO CAPS: 500.0000 mg | ORAL_CAPSULE | Freq: Four times a day (QID) | ORAL | 0 refills | Status: AC

## 2024-08-21 MED ORDER — ONDANSETRON HCL 4 MG PO TABS: 4.0000 mg | ORAL_TABLET | Freq: Three times a day (TID) | ORAL | 0 refills | Status: AC | PRN

## 2024-08-21 MED ORDER — TRAMADOL HCL 50 MG PO TABS: 50.0000 mg | ORAL_TABLET | Freq: Three times a day (TID) | ORAL | 0 refills | Status: AC | PRN

## 2024-08-21 NOTE — Progress Notes (Cosign Needed)
 "    Patient ID: Debra Nixon, female    DOB: September 09, 1990, 34 y.o.   MRN: [id number]  Chief Complaint  Patient presents with   Pre-op Exam      ICD-10-CM   1. Macromastia  N62        History of Present Illness: Debra Nixon is a 34 y.o.  female  with a history of macromastia.  She presents for preoperative evaluation for upcoming procedure, Bilateral Breast Reduction with liposuction, scheduled for 09/17/2024 with Dr.  Lowery  The patient has not had problems with anesthesia.  Patient reports she had a mammogram back in October which is negative.  She reports her grandmother had breast cancer.  Patient denies any history of cardiac disease.  She denies taking any blood thinners.  Patient reports she is not a smoker.  Patient denies taking any birth control or hormone replacement.  She denies history of greater than 3 miscarriages.  She denies any personal family history of blood clots or clotting diseases.  She denies any recent surgeries, traumas or infections.  She does report she recently had a dental procedure for a temporary crown placement last Monday.  She denies any issues since this procedure.  She states that she will need another procedure with the dentist for placement of permanent crown.  She denies any history of stroke or heart attack.  She denies any history of Crohn's disease or ulcerative colitis, COPD or asthma.  She denies any history of cancer.  She does report that she has some spider veins on her legs, but denies any varicose veins.  She denies any swollen legs.  She denies any recent fevers, chills or changes in her health.  Patient reports she is currently a 36 DD cup.  She states that she would like to be a C cup.  Discussed with patient that cup size cannot be guaranteed.  She expressed understanding.  Patient reports that she has history of thrombocytopenia.  She denies any issues with her thrombocytopenia.  Summary of Previous Visit: Patient was seen for  initial consult by Dr. Lowery on 01/21/2024.  At this visit, patient reported upper back and neck pain due to her enlarged breasts.  On exam, her STN on the right was 29 cm and her STN on the left was 27 cm.  Her weight was 165 pounds and her BMI was 30.2 kg/m.  Her bra size was a DD cup.  The estimated amount of breast tissue to be removed at the time of surgery was 398 g on the left and 450 g on the right.  Patient was found to be a good candidate for bilateral breast reduction.  It was noted that patient would require more weight off of the right compared to the left.  A referral was placed to healthy weight and wellness and the plan was for the patient to return to our clinic when she has finished her consult with them since she was trying to lose a little bit more weight.    Patient was then seen in the clinic again on 04/23/2024.  At this visit, patient was doing well.  She reported that she was happy with her weight.  Patient reported that she had a grandmother with history of breast cancer and reported that she had a mammogram about 5 years prior for mastitis.  Surgical route was placed.  Mammogram was also ordered for the patient.  Mammogram results were negative.  Estimated excess breast tissue to  be removed at time of surgery: 398 grams on the left, 450 grams on the right  Job: Works in audiological scientist, planning to take the few days off after surgery and start working from home the Monday after surgery.  She states that she plans to go back to the office the following week.  PMH Significant for: IBS, GERD, thrombocytopenia, macromastia   Past Medical History: Allergies: Allergies[1]  Current Medications: Current Medications[2]  Past Medical Problems: Past Medical History:  Diagnosis Date   Anxiety    GERD (gastroesophageal reflux disease)    IBS (irritable bowel syndrome)    Thrombocytopenia     Past Surgical History: Past Surgical History:  Procedure Laterality Date   CESAREAN  SECTION N/A 08/27/2018   Procedure: CESAREAN SECTION;  Surgeon: Kandyce Sor, MD;  Location: [facility] BIRTHING SUIT[facility];  Service: Obstetrics;  Laterality: N/A;   JOINT REPLACEMENT      Social History: Social History   Socioeconomic History   Marital status: Married    Spouse name: Not on file   Number of children: Not on file   Years of education: Not on file   Highest education level: Not on file  Occupational History   Not on file  Tobacco Use   Smoking status: Never   Smokeless tobacco: Never  Vaping Use   Vaping status: Never Used  Substance and Sexual Activity   Alcohol use: Not Currently   Drug use: Never   Sexual activity: Not on file  Other Topics Concern   Not on file  Social History Narrative   Not on file   Social Drivers of Health   Tobacco Use: Low Risk (05/20/2024)   Patient History    Smoking Tobacco Use: Never    Smokeless Tobacco Use: Never    Passive Exposure: Not on file  Financial Resource Strain: Not on file  Food Insecurity: Not on file  Transportation Needs: Not on file  Physical Activity: Not on file  Stress: Not on file  Social Connections: Unknown (12/06/2021)   Received from [facility]   Social Network    Social Network: Not on file  Intimate Partner Violence: Unknown (10/28/2021)   Received from [facility]   HITS    Physically Hurt: Not on file    Insult or Talk Down To: Not on file    Threaten Physical Harm: Not on file    Scream or Curse: Not on file  Depression (PHQ2-9): Not on file  Alcohol Screen: Not on file  Housing: Not on file  Utilities: Not on file  Health Literacy: Not on file    Family History: Family History  Problem Relation Age of Onset   Melanoma Father    Breast cancer Paternal Grandmother     Review of Systems: Denies any recent fevers, chills or changes in her health  Physical Exam: Vital Signs BP 91/67 (BP Location: Left Arm, Patient Position: Sitting, Cuff Size: Normal)   Pulse 76   Wt 158 lb  (71.7 kg)   SpO2 96%   BMI 28.90 kg/m   Physical Exam  Constitutional:      General: Not in acute distress.    Appearance: Normal appearance. Not ill-appearing.  HENT:     Head: Normocephalic and atraumatic.  Eyes:     Pupils: Pupils are equal, round Cardiovascular:     Rate and Rhythm: Normal rate Pulmonary:     Effort: Pulmonary effort is normal. No respiratory distress.  Musculoskeletal: Ambulatory   Skin:  Findings: No erythema or rash.  Neurological:     Mental Status: Alert and oriented to person, place, and time. Mental status is at baseline.  Psychiatric:        Mood and Affect: Mood normal.        Behavior: Behavior normal.    Assessment/Plan: The patient is scheduled for bilateral breast reduction with Dr. Lowery.  Risks, benefits, and alternatives of procedure discussed, questions answered and consent obtained.    Smoking Status: Non-smoker; Counseling Given?  N/A Last Mammogram: 05/20/2024; Results: BI-RADS Category 1 negative  Caprini Score: 3; Risk Factors include: Age, BMI > 25, and length of planned surgery. Recommendation for mechanical prophylaxis. Encourage early ambulation.   Pictures obtained: @consult   Post-op Rx sent to pharmacy: Keflex , Zofran , tramadol -patient states that she did not like the way the oxycodone  made her feel in the past.  She states that she wanted to try tramadol  for postoperative pain.  Discussed with her to always take Tylenol  and ibuprofen  first for her pain and only take the tramadol  if needed.  She expressed understanding.  Patient states that she is only taking multivitamins, Zoloft and Claritin .  Discussed with her to hold her multivitamins at least 1 week prior to surgery.  She expressed understanding.  Patient was provided with the breast reduction and General Surgical Risk consent document and Pain Medication Agreement prior to their appointment.  They had adequate time to read through the risk consent documents and  Pain Medication Agreement. We also discussed them in person together during this preop appointment. All of their questions were answered to their satisfaction.  Recommended calling if they have any further questions.  Risk consent form and Pain Medication Agreement to be scanned into patient's chart.  The risk that can be encountered with breast reduction were discussed and include the following but not limited to these:  Breast asymmetry, fluid accumulation, firmness of the breast, inability to breast feed, loss of nipple or areola, skin loss, decrease or no nipple sensation, fat necrosis of the breast tissue, bleeding, infection, healing delay.  There are risks of anesthesia, changes to skin sensation and injury to nerves or blood vessels.  The muscle can be temporarily or permanently injured.  You may have an allergic reaction to tape, suture, glue, blood products which can result in skin discoloration, swelling, pain, skin lesions, poor healing.  Any of these can lead to the need for revisonal surgery or stage procedures.  A reduction has potential to interfere with diagnostic procedures.  Nipple or breast piercing can increase risks of infection.  This procedure is best done when the breast is fully developed.  Changes in the breast will continue to occur over time.  Pregnancy can alter the outcomes of previous breast reduction surgery, weight gain and weigh loss can also effect the long term appearance.   Patient had questions about her family history of breast cancer and screening mammograms from now until the age of 30.  I recommended that she follow-up with her PCP in regards to this as they would be the ones to order any additional mammograms in the future.  She expressed understanding.  Electronically signed by: Estefana FORBES Peck, PA-C 08/21/2024 3:26 PM      [1] No Known Allergies [2]  Current Outpatient Medications:    cephALEXin  (KEFLEX ) 500 MG capsule, Take 1 capsule (500 mg total) by  mouth 4 (four) times daily for 3 days., Disp: 12 capsule, Rfl: 0   levocetirizine (XYZAL) 2.5 MG/5ML  solution, Take 2.5 mg by mouth every evening., Disp: , Rfl:    ondansetron  (ZOFRAN ) 4 MG tablet, Take 1 tablet (4 mg total) by mouth every 8 (eight) hours as needed for up to 15 doses for nausea or vomiting., Disp: 15 tablet, Rfl: 0   sertraline (ZOLOFT) 100 MG tablet, Take 100 mg by mouth daily., Disp: , Rfl:    traMADol  (ULTRAM ) 50 MG tablet, Take 1 tablet (50 mg total) by mouth every 8 (eight) hours as needed for up to 15 doses for moderate pain (pain score 4-6) or severe pain (pain score 7-10)., Disp: 15 tablet, Rfl: 0  "

## 2024-09-17 ENCOUNTER — Encounter (HOSPITAL_BASED_OUTPATIENT_CLINIC_OR_DEPARTMENT_OTHER): Payer: Self-pay

## 2024-09-17 ENCOUNTER — Ambulatory Visit (HOSPITAL_BASED_OUTPATIENT_CLINIC_OR_DEPARTMENT_OTHER): Admit: 2024-09-17 | Admitting: Plastic Surgery

## 2024-09-17 DIAGNOSIS — G8929 Other chronic pain: Secondary | ICD-10-CM

## 2024-09-17 DIAGNOSIS — M542 Cervicalgia: Secondary | ICD-10-CM

## 2024-09-17 DIAGNOSIS — N62 Hypertrophy of breast: Secondary | ICD-10-CM

## 2024-09-17 SURGERY — BREAST REDUCTION WITH LIPOSUCTION
Anesthesia: Choice | Site: Breast | Laterality: Bilateral

## 2024-09-26 ENCOUNTER — Encounter: Admitting: Plastic Surgery

## 2024-10-10 ENCOUNTER — Encounter: Admitting: Plastic Surgery
